# Patient Record
Sex: Male | Born: 1980
Health system: Southern US, Community
[De-identification: ages and names within clinical notes are randomized; demographics above are authoritative.]

## PROBLEM LIST (undated history)

## (undated) DIAGNOSIS — F32A Depression, unspecified: Secondary | ICD-10-CM

## (undated) DIAGNOSIS — F329 Major depressive disorder, single episode, unspecified: Secondary | ICD-10-CM

## (undated) DIAGNOSIS — F1110 Opioid abuse, uncomplicated: Secondary | ICD-10-CM

## (undated) DIAGNOSIS — F1910 Other psychoactive substance abuse, uncomplicated: Secondary | ICD-10-CM

## (undated) DIAGNOSIS — T402X1A Poisoning by other opioids, accidental (unintentional), initial encounter: Secondary | ICD-10-CM

## (undated) DIAGNOSIS — G709 Myoneural disorder, unspecified: Secondary | ICD-10-CM

## (undated) DIAGNOSIS — F419 Anxiety disorder, unspecified: Secondary | ICD-10-CM

## (undated) HISTORY — DX: Anxiety disorder, unspecified: F41.9

## (undated) HISTORY — DX: Major depressive disorder, single episode, unspecified: F32.9

## (undated) HISTORY — PX: FRACTURE SURGERY: SHX138

## (undated) HISTORY — DX: Myoneural disorder, unspecified: G70.9

## (undated) HISTORY — PX: HERNIA REPAIR: SHX51

## (undated) HISTORY — PX: SPINE SURGERY: SHX786

## (undated) HISTORY — DX: Depression, unspecified: F32.A

---

## 2000-08-18 ENCOUNTER — Encounter: Payer: Self-pay | Admitting: Orthopedic Surgery

## 2000-08-18 ENCOUNTER — Encounter: Admission: RE | Admit: 2000-08-18 | Discharge: 2000-08-18 | Payer: Self-pay | Admitting: Orthopedic Surgery

## 2002-08-25 ENCOUNTER — Emergency Department (HOSPITAL_COMMUNITY): Admission: EM | Admit: 2002-08-25 | Discharge: 2002-08-25 | Payer: Self-pay | Admitting: Emergency Medicine

## 2002-08-25 ENCOUNTER — Encounter: Payer: Self-pay | Admitting: Emergency Medicine

## 2005-07-23 ENCOUNTER — Ambulatory Visit (HOSPITAL_BASED_OUTPATIENT_CLINIC_OR_DEPARTMENT_OTHER): Admission: RE | Admit: 2005-07-23 | Discharge: 2005-07-23 | Payer: Self-pay | Admitting: Orthopedic Surgery

## 2005-12-28 ENCOUNTER — Ambulatory Visit (HOSPITAL_BASED_OUTPATIENT_CLINIC_OR_DEPARTMENT_OTHER): Admission: RE | Admit: 2005-12-28 | Discharge: 2005-12-28 | Payer: Self-pay | Admitting: Orthopedic Surgery

## 2009-05-23 ENCOUNTER — Encounter: Admission: RE | Admit: 2009-05-23 | Discharge: 2009-05-23 | Payer: Self-pay | Admitting: Family Medicine

## 2009-09-07 ENCOUNTER — Emergency Department (HOSPITAL_BASED_OUTPATIENT_CLINIC_OR_DEPARTMENT_OTHER): Admission: EM | Admit: 2009-09-07 | Discharge: 2009-09-07 | Payer: Self-pay | Admitting: Emergency Medicine
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
## 2010-05-29 NOTE — Op Note (Signed)
NAME:  Samuel Andrade, Samuel Andrade NO.:  [id number]   MEDICAL RECORD NO.:  [id number]          PATIENT TYPE:  AMB   LOCATION:  DSC                          FACILITY:  [facility]   PHYSICIAN:  Katy Fitch. Sypher, M.D. DATE OF BIRTH:  05-07-1980   DATE OF PROCEDURE:  12/28/2005  DATE OF DISCHARGE:                               OPERATIVE REPORT   .   PREOPERATIVE DIAGNOSIS:  Status post comminuted proximal phalangeal,  proximal metaphyseal and periarticular fracture of left small finger  sustained in July of 2007, status post closed reduction and percutaneous  intramedullary Kirschner wire fixation with development of flexor  tenodesis and inability to actively flex left small finger IP joints.   POSTOPERATIVE DIAGNOSIS:  Status post comminuted proximal phalangeal,  proximal metaphyseal and periarticular fracture of left small finger  sustained in July of 2007, status post closed reduction and percutaneous  intramedullary Kirschner wire fixation with development of flexor  tenodesis and inability to actively flex left small finger IP joints.   OPERATION:  Flexor tenolysis of flexor digitorum superficialis and  profundus tendons beneath A1, A2 and A3 pulleys.   OPERATING SURGEON:  Samuel Andrade, M.D.   ASSISTANT:  Samuel Andrade, P.A.-C.   ANESTHESIA:  Is general sedation and proximal forearm level IV regional  supplemented by a 0.25% Marcaine metacarpal head level block;  supervising anesthesiologist, Dr. Gypsy Balsam.   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 Kristeen Miss for evaluation and  management of a comminuted displaced fracture of left small finger  proximal phalanx in July of 2007.   We advised Samuel Andrade to undergo closed reduction percutaneous  Kirschner wire fixation.  Surgery was accomplished on July 23, 2005.  Anatomic reduction of the fracture was achieved.   Despite positioning Samuel Andrade finger  to allow immediate passive  range of motion exercises while his fracture healed, he developed a  profound tenodesis of his superficialis and profundus tendons, leading  him to have full passive motion of his finger but minimal active  flexion.   We allowed Samuel Andrade to perform exercise for 5 months.   His fracture went on to heal anatomically.  However, x-rays  approximately 3 months postoperative demonstrated a turret exostosis on  the flexor surface of his proximal phalangeal metaphysis at the site of  probable adherence of his profundus and superficialis tendons to the  fracture callus.   We advised tenolysis at this time.   He is now on winter break from school and returns for flexor tenolysis   PROCEDURE:  Samuel Andrade is brought to the operating room and placed in  supine position on the operating table.   Following light sedation, an IV regional block was placed at proximal  forearm level on the left.  The arm was then prepped with Betadine soap  solution and sterilely draped.  At 12 minutes, anesthesia was excellent.   The arm was prepped with Betadine soap solution and sterilely draped.  The procedure commenced with a Brunner zigzag incision exposing A2 and  A1.  A  series of tenolysis knives, micro tenotomy scissors and Freer  elevator dissections were accomplished, relieving adhesions between the  superficialis profundus tendons deep to the A1 and A2 pulleys.  With the  use of an Allis clamp, traction was applied to the superficialis  profundus tendons.  Tenodesis still persisted at the distal portion of  the A2 pulley.  Therefore, a second flexor sheath exposure was created  distal to the A2 pulley, proximal the C1.   At this level, the fracture callus was exposed and a Therapist, nutritional used  to separate the tendon from the periosteum and callus.  The tenolysis  knives were used as well followed by use of a rongeur to remove  hypertrophic scar from the dorsal  surface of the profundus tendon.   Thereafter, with traction, full passive flexion of the interphalangeal  joints was recovered.  The superficialis tendons still had the degree of  tenodesis over the proximal phalangeal segment; however, due to the  primary function of the small finger profundus, I elected not to  completely dissect free the superficialis tendons out of concern for  creating a possible rupture due to loss of blood supply.   [name]'s wounds were then infiltrated with 0.25% Marcaine.  The  tourniquet was released, and he was awakened from sedation.  Mr.  Anaya was able to demonstrate full active flexion of his finger,  bringing the small finger nail to the distal palmar crease.  He was able  to fully extend the MP joint and extend the PIP joint to a 5-degree  flexion contracture.   He was able to witness this range of motion.  His wound was then  repaired with mattress sutures of 5-0 nylon followed by dressing with  Xeroflo sterile gauze and Coban.  There were no apparent complications.   For aftercare, [name] is provided prescription for Keflex 500 mg 1  p.o. q.8h. x4 days for prophylactic antibiotic and Percocet 5 mg 1 p.o.  q.4-6h. pain 30 tablets without refill and finally Motrin 600 mg 1 p.o.  q.6h. p.r.n. pain.      Katy Fitch Sypher, M.D.  Electronically Signed     RVS/MEDQ  D:  12/28/2005  T:  12/29/2005  Job:  [id number]   cc:   Dyke Brackett, M.D.

## 2011-06-18 ENCOUNTER — Ambulatory Visit (INDEPENDENT_AMBULATORY_CARE_PROVIDER_SITE_OTHER): Payer: BC Managed Care – PPO | Admitting: Family Medicine

## 2011-06-18 VITALS — BP 117/77 | HR 72 | Temp 98.4°F | Resp 16 | Ht 75.75 in | Wt 244.0 lb

## 2011-06-18 DIAGNOSIS — M5116 Intervertebral disc disorders with radiculopathy, lumbar region: Secondary | ICD-10-CM

## 2011-06-18 DIAGNOSIS — M5126 Other intervertebral disc displacement, lumbar region: Secondary | ICD-10-CM

## 2011-06-18 DIAGNOSIS — M546 Pain in thoracic spine: Secondary | ICD-10-CM

## 2011-06-18 DIAGNOSIS — M549 Dorsalgia, unspecified: Secondary | ICD-10-CM

## 2011-06-18 MED ORDER — HYDROCODONE-ACETAMINOPHEN 5-500 MG PO TABS: 1.0000 | ORAL_TABLET | ORAL | Status: AC | PRN

## 2011-06-18 MED ORDER — OXAPROZIN 600 MG PO TABS: ORAL_TABLET | ORAL | Status: DC

## 2011-06-18 MED ORDER — PREDNISONE 20 MG PO TABS: ORAL_TABLET | ORAL | Status: DC

## 2011-06-18 NOTE — Patient Instructions (Signed)
Rest. Ice back several times daily, or alternate heat then ice Return in 4-5 days if not much worse

## 2011-06-18 NOTE — Progress Notes (Signed)
Subjective: 30 year old white male who slipped in the driveway last night. His left leg extended outward. He did not fall down all the way. However he did have pain and a pop sensation when this happened. He went in the house and pain got steadily worse. It hurt him at about a 6-7/10 level of pain. He had pain during the night last night. He did take about 4 Flexeril over the course of time. These were from an old prescription. He has a prior history of lumbar disc disease with surgery in 2003. He's had some back problems since that time, and taking medications and physical therapy for that. His last bad episode was last October. He does see a Dr. Teressa Senter at the neurosurgery group. 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 He does not do physical labor. He is not have children. He lives alone.  Objective: old lumbar scar is visible. Not much tenderness of the midline low back. He describes pain radiating down the left hip. Not not extremely tender to touch. Straight leg raising test is positive at 30 on the right with contralateral pain down the left leg. Left leg straight leg raising test is positive at about 10. Deep tendon reflexes are minimal but symmetrical.  Assessment: Old Lumbar disc disease with current injury and left lumbar sciatica.  Plan: NSAID Prednisone Pain medicine Muscle relaxant If he is not getting relief or probably send him back to his back surgeon.

## 2012-04-21 ENCOUNTER — Ambulatory Visit (INDEPENDENT_AMBULATORY_CARE_PROVIDER_SITE_OTHER): Payer: BC Managed Care – PPO | Admitting: Family Medicine

## 2012-04-21 VITALS — BP 139/79 | HR 72 | Temp 98.0°F | Resp 17 | Ht 72.0 in | Wt 211.0 lb

## 2012-04-21 DIAGNOSIS — M5126 Other intervertebral disc displacement, lumbar region: Secondary | ICD-10-CM

## 2012-04-21 DIAGNOSIS — M5116 Intervertebral disc disorders with radiculopathy, lumbar region: Secondary | ICD-10-CM

## 2012-04-21 DIAGNOSIS — M549 Dorsalgia, unspecified: Secondary | ICD-10-CM

## 2012-04-21 MED ORDER — OXAPROZIN 600 MG PO TABS: ORAL_TABLET | ORAL | Status: DC

## 2012-04-21 MED ORDER — OXYCODONE-ACETAMINOPHEN 5-325 MG PO TABS: 1.0000 | ORAL_TABLET | Freq: Three times a day (TID) | ORAL | Status: DC | PRN

## 2012-04-21 MED ORDER — CYCLOBENZAPRINE HCL 10 MG PO TABS: 10.0000 mg | ORAL_TABLET | Freq: Three times a day (TID) | ORAL | Status: DC | PRN

## 2012-04-21 MED ORDER — PREDNISONE 20 MG PO TABS: ORAL_TABLET | ORAL | Status: AC

## 2012-04-21 MED ORDER — KETOROLAC TROMETHAMINE 60 MG/2ML IM SOLN
60.0000 mg | Freq: Once | INTRAMUSCULAR | Status: AC
  Administered 2012-04-21: 60 mg via INTRAMUSCULAR

## 2012-04-21 NOTE — Progress Notes (Signed)
[facility]:  Office Visit  Chief Complaint:  Chief Complaint  Patient presents with  . Hip Pain  . Back Pain  . Leg Pain    HPI: Samuel Andrade is a 32 y.o. male who complains of  Acute on chronic back pain, left sided sharp 9-10/10 stabbing pain. Just woke up with it 3 days ago and has not been to work since. 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 Gets really stiff as day progresses, it has progressively gotten worse. Some tingling in big toe and 2nd toes and 5th toe. Pain starts in lower back and then buttock, hip and laterally and posteriorly and then laterally. Has had 2 prior herniated disc s/p surgery by Dr. Emmie Niemann but was has been going to see Dr. Phoebe Perch at [facility] in 2011, last saw him over 1 year ago and MRI was done and was told to return if have repeat sxs. Currently denies any incontinece, feels similar to last episode treated with prednisone, daypro and norco.   MRI from 2011: L4-5: Previous posterior decompression and discectomy on the left.  Recurrent disc herniation in the left posterolateral direction with  caudal down turning. Potential for neural compression on the left.  L5-S1: Previous posterior decompression. Recurrent disc  herniation centrally, somewhat prominent towards the left.  Potential for compression of either S1 nerve root, more likely the  left.   History reviewed. No pertinent past medical history. Past Surgical History  Procedure Laterality Date  . Hernia repair    . Spine surgery    . Fracture surgery     History   Social History  . Marital Status: Single    Spouse Name: N/A    Number of Children: N/A  . Years of Education: N/A   Social History Main Topics  . Smoking status: Current Every Day Smoker -- 0.50 packs/day for 9 years    Types: Cigarettes  . Smokeless tobacco: None  . Alcohol Use: Yes     Comment: once a week  . Drug Use: No  . Sexually Active: Not Currently    Other Topics Concern  . None   Social History Narrative  . None   Family History  Problem Relation Age of Onset  . Hypertension Father    No Known Allergies Prior to Admission medications   Medication Sig Start Date End Date Taking? Authorizing Provider  cyclobenzaprine (FLEXERIL) 10 MG tablet Take 10 mg by mouth 3 (three) times daily as needed.    Historical Provider, MD  oxaprozin (DAYPRO) 600 MG tablet One twice daily for pain and inflammation 06/18/11 06/17/12  Peyton Najjar, MD     ROS: The patient denies fevers, chills, night sweats, unintentional weight loss, chest pain, palpitations, wheezing, dyspnea on exertion, nausea, vomiting, abdominal pain, dysuria, hematuria, melena; + numbness, weakness, tingling.  All other systems have been reviewed and were otherwise negative with the exception of those mentioned in the HPI and as above.    PHYSICAL EXAM: Filed Vitals:   04/21/12 1820  BP: 139/79  Pulse: 72  Temp: 98 F (36.7 C)  Resp: 17   Filed Vitals:   04/21/12 1820  Height: 6' (1.829 m)  Weight: 211 lb (95.709 kg)   Body mass index is 28.61 kg/(m^2).  General: Alert,  Moderate  distress HEENT:  Normocephalic, atraumatic, oropharynx patent.  Cardiovascular:  Regular rate and rhythm, no rubs murmurs or gallops.  No Carotid bruits, radial  pulse intact. No pedal edema.  Respiratory: Clear to auscultation bilaterally.  No wheezes, rales, or rhonchi.  No cyanosis, no use of accessory musculature GI: No organomegaly, abdomen is soft and non-tender, positive bowel sounds.  No masses. Skin: No rashes. Neurologic: Facial musculature symmetric. Psychiatric: Patient is appropriate throughout our interaction. Lymphatic: No cervical lymphadenopathy Musculoskeletal: Gait intact. Low back tender Left leg + decrease ROM + straight leg + decrease sensation + good cap refill Slightly hyperreflexive No saddle anesthesia    LABS: No results found for this or any  previous visit.   EKG/XRAY:   Primary read interpreted by Dr. Conley Rolls at [facility].   ASSESSMENT/PLAN: Encounter Diagnoses  Name Primary?  . Lumbar disc disease with radiculopathy Yes  . Back pain    Pleasant 32 y/o male who is in moderate-sever distress from acute on chronic back pain with radiculopathy.  Rx Prednisone, Daypro, Flexeril, Percocet 1 Dose of toradol given to patient Referral to neurosicence ASAP Will defer xrays because what he needs is an MRI pending neuro referral.   Sxs similar to last episode. Advise to got to ER if sxs worsen ie incontinence, saddle anessthesia, worsening numbness/weakness    Samuel Westergren PHUONG, DO 04/21/2012 7:01 PM

## 2012-04-26 ENCOUNTER — Other Ambulatory Visit: Payer: Self-pay | Admitting: Family Medicine

## 2012-04-26 ENCOUNTER — Telehealth: Payer: Self-pay

## 2012-04-26 DIAGNOSIS — M549 Dorsalgia, unspecified: Secondary | ICD-10-CM

## 2012-04-26 MED ORDER — OXYCODONE-ACETAMINOPHEN 5-325 MG PO TABS: 1.0000 | ORAL_TABLET | Freq: Three times a day (TID) | ORAL | Status: DC | PRN

## 2012-04-26 NOTE — Telephone Encounter (Signed)
Patient is checking on status of referral  Patient is requesting refills on oxyCODONE-acetaminophen (ROXICET) 5-325 MG per tablet   [phone] (H)

## 2012-04-26 NOTE — Telephone Encounter (Signed)
Dr. Conley Rolls, This patient would like to know if you can refill his oxycodone?  His referral is still in the process of being made.

## 2012-04-26 NOTE — Telephone Encounter (Signed)
Spoke with patient and let him know that they are still working on referral.  We would call him if Dr. Conley Rolls would like to fill his oxycodone.  He stated that was fine

## 2012-05-15 ENCOUNTER — Telehealth: Payer: Self-pay

## 2012-05-15 NOTE — Telephone Encounter (Signed)
PT REQUESTING PAIN MEDS UNTIL HIS  APPT WITH NEUROSURGEON MAY 12.    BEST PHONE [phone]   [facility] @ [place].

## 2012-05-16 ENCOUNTER — Other Ambulatory Visit: Payer: Self-pay | Admitting: Family Medicine

## 2012-05-16 DIAGNOSIS — M549 Dorsalgia, unspecified: Secondary | ICD-10-CM

## 2012-05-16 DIAGNOSIS — M5116 Intervertebral disc disorders with radiculopathy, lumbar region: Secondary | ICD-10-CM

## 2012-05-16 MED ORDER — OXYCODONE-ACETAMINOPHEN 5-325 MG PO TABS: 1.0000 | ORAL_TABLET | Freq: Three times a day (TID) | ORAL | Status: DC | PRN

## 2012-05-16 MED ORDER — CYCLOBENZAPRINE HCL 10 MG PO TABS: 10.0000 mg | ORAL_TABLET | Freq: Three times a day (TID) | ORAL | Status: DC | PRN

## 2012-05-16 MED ORDER — OXAPROZIN 600 MG PO TABS: ORAL_TABLET | ORAL | Status: AC

## 2012-05-16 NOTE — Telephone Encounter (Signed)
Patient notified and voiced understanding.

## 2012-10-30 ENCOUNTER — Ambulatory Visit (INDEPENDENT_AMBULATORY_CARE_PROVIDER_SITE_OTHER): Payer: BC Managed Care – PPO | Admitting: Family Medicine

## 2012-10-30 VITALS — BP 132/88 | HR 81 | Temp 98.0°F | Resp 18 | Wt 216.0 lb

## 2012-10-30 DIAGNOSIS — B079 Viral wart, unspecified: Secondary | ICD-10-CM

## 2012-10-30 NOTE — Progress Notes (Signed)
  Subjective:  This chart was scribed for Samuel Sidle, MD by Quintella Reichert, ED scribe.  This patient was seen in room [facility] Room 13 and the patient's care was started at 7:37 PM.   Patient ID: Samuel Andrade, male    DOB: 03/23/1980, 32 y.o.   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 Pt states that for the past month he had a painful "big black thing" on the bottom of his right foot that was growing progressively-larger and more painful.  On arrival when he removed his shoe the area was gone and he saw a shard of glass there which he picked out and the area began bleeding.  He also has a black spot on his right index finger which he noticed about 10 days ago which he initially thought was a scab.  This area has also been growing progressively more painful.  He notes that he was picking up toads several days prior to noticing this.  Pt notes that he has a family h/o melanoma and this is why his mother recommended he have the areas checked.     No past medical history on file.   Prior to Admission medications   Medication Sig Start Date End Date Taking? Authorizing Provider  cyclobenzaprine (FLEXERIL) 10 MG tablet Take 1 tablet (10 mg total) by mouth 3 (three) times daily as needed. 05/16/12   Thao P Le, DO  oxaprozin (DAYPRO) 600 MG tablet One twice daily for pain and inflammation 05/16/12 05/16/13  Thao P Le, DO  oxyCODONE-acetaminophen (ROXICET) 5-325 MG per tablet Take 1 tablet by mouth every 8 (eight) hours as needed for pain. 05/16/12   Thao P Le, DO    No Known Allergies   Review of Systems  Skin:       Small painful discolored areas on right foot and right index finger       Objective:   Physical Exam  Nursing note and vitals reviewed. Constitutional: No distress.  Cardiovascular: Normal rate.    Pulmonary/Chest: Effort normal. No respiratory distress.  Neurological: He is alert.  Skin: Skin is warm and dry.  Right index finger has 2-mm hyperpigmented, crusty papule Right plantar foot has 5-mm verrucous lesion at the head of the 4th metatarsal Both of these areas were sprayed with liquid nitrogen for 20 seconds  Psychiatric: He has a normal mood and affect. His behavior is normal.     BP 132/88  Pulse 81  Temp(Src) 98 F (36.7 C) (Oral)  Resp 18  Wt 216 lb (97.977 kg)  BMI 29.29 kg/m2     Assessment & Plan:   Wart on right foot and right index finger  Warts Recheck 3 weeks unless completely cleared Signed, Samuel Sidle, MD

## 2013-02-09 ENCOUNTER — Ambulatory Visit (INDEPENDENT_AMBULATORY_CARE_PROVIDER_SITE_OTHER): Payer: BC Managed Care – PPO | Admitting: Family Medicine

## 2013-02-09 VITALS — BP 132/82 | HR 61 | Temp 98.0°F | Resp 17 | Ht 76.0 in | Wt 220.0 lb

## 2013-02-09 DIAGNOSIS — F32A Depression, unspecified: Secondary | ICD-10-CM

## 2013-02-09 DIAGNOSIS — F4323 Adjustment disorder with mixed anxiety and depressed mood: Secondary | ICD-10-CM

## 2013-02-09 DIAGNOSIS — F3289 Other specified depressive episodes: Secondary | ICD-10-CM

## 2013-02-09 DIAGNOSIS — F329 Major depressive disorder, single episode, unspecified: Secondary | ICD-10-CM

## 2013-02-09 MED ORDER — SERTRALINE HCL 50 MG PO TABS: 50.0000 mg | ORAL_TABLET | Freq: Every day | ORAL | Status: DC

## 2013-02-09 MED ORDER — LORAZEPAM 1 MG PO TABS: 1.0000 mg | ORAL_TABLET | Freq: Two times a day (BID) | ORAL | Status: DC | PRN

## 2013-02-09 NOTE — Patient Instructions (Signed)
Recommend seeing a counselor:  Karmen BongoAaron Stewart: [address] [phone] Maisie Fushomas Hedding: [address]  [phone]  Sertraline 50 mg one daily Lorazepam 1 mg twice daily  Return in one week

## 2013-02-09 NOTE — Progress Notes (Signed)
Subjective: Patient's girlfriend of 2 or 3 years has a history of bipolar and a lot of issues. She moved into a group home setting recently, operated by a church group, that is designed to help get young lady's like her to restart on in life back on the feet. This is been very hard on him. She's been gone 5 days, and he is not allowed communication with anyone outside of the group setting. He has gotten very depressed. He has had panic attack type palpitations and shortness of breath. He is not sleeping well. He missed class and work on Wednesday, otherwise has kept on functioning. His father is the person he can talk to, and his father urged him to come on in here. He denies any suicidal thoughts. He occasionally attends a quicker church with his mother, but does not have any major spiritual support. He used to exercise regularly, walking 3-5 miles daily, but has not been doing that recently. He does have history of some back pain. He rarely drinks and does not use drugs.  Objective: Very depressed countenance. He says normally he is much more upbeat.  Assessment: Depression Anxiety and panic  Plan: Sertraline 50 mg one daily Lorazepam 1 mg twice a day when necessary Return in one week for re\re check  Recommended he see a counselor and I gave him a couple of names.

## 2014-01-17 ENCOUNTER — Emergency Department (HOSPITAL_COMMUNITY)
Admission: EM | Admit: 2014-01-17 | Discharge: 2014-01-18 | Disposition: A | Discharge status: Home / Self Care | Payer: BLUE CROSS/BLUE SHIELD | Attending: Emergency Medicine | Admitting: Emergency Medicine

## 2014-01-17 ENCOUNTER — Encounter (HOSPITAL_COMMUNITY): Payer: Self-pay

## 2014-01-17 DIAGNOSIS — Z9889 Other specified postprocedural states: Secondary | ICD-10-CM | POA: Insufficient documentation

## 2014-01-17 DIAGNOSIS — Z720 Tobacco use: Secondary | ICD-10-CM | POA: Insufficient documentation

## 2014-01-17 DIAGNOSIS — M5442 Lumbago with sciatica, left side: Secondary | ICD-10-CM | POA: Diagnosis not present

## 2014-01-17 DIAGNOSIS — M549 Dorsalgia, unspecified: Secondary | ICD-10-CM | POA: Diagnosis present

## 2014-01-17 DIAGNOSIS — G8929 Other chronic pain: Secondary | ICD-10-CM | POA: Diagnosis not present

## 2014-01-17 NOTE — ED Notes (Signed)
Pt states that he had back surgery several years ago and the pain is back in his lower back that radiates down his leg, this is affecting his sleep. His parents are here and want him evaluated for substance abuse, he denies. Pt is nodding off in triage and states he only wants to be evaluated for insomnia and back pain.

## 2014-01-18 LAB — RAPID URINE DRUG SCREEN, HOSP PERFORMED
AMPHETAMINES: NOT DETECTED
Barbiturates: NOT DETECTED
Benzodiazepines: NOT DETECTED
Cocaine: NOT DETECTED
Opiates: NOT DETECTED
TETRAHYDROCANNABINOL: NOT DETECTED

## 2014-01-18 MED ORDER — IBUPROFEN 800 MG PO TABS
800.0000 mg | ORAL_TABLET | Freq: Once | ORAL | Status: AC
  Administered 2014-01-18: 800 mg via ORAL
  Filled 2014-01-18: qty 1

## 2014-01-18 NOTE — BH Assessment (Signed)
Spoke to Myna HidalgoMaria C Teup, RN who said Pt no longer wants to speak to TTS and that Dr. Mora Bellmanni had cancelled the TTS consult order.  Harlin RainFord Ellis Ria CommentWarrick Jr, LPC, [facility] Triage Specialist [phone]

## 2014-01-18 NOTE — ED Provider Notes (Signed)
CSN: [id number]     Arrival date & time 01/17/14  2248 History   First MD Initiated Contact with Patient 01/18/14 0040     Chief Complaint  Patient presents with  . Back Pain     (Consider location/radiation/quality/duration/timing/severity/associated sxs/prior Treatment) HPI  Samuel Andrade is a 34 y.o. male with past medical history of heroin abuse presenting today with acute on chronic back pain. Patient states he had back surgery 13 years ago, over the past month he's had new sharp pain that shoots down his left leg to his foot. He states this feels like nerve pain, he denies this occurring previously. He denies any fevers or recent infections or history of malignancy. Patient has not seen his neurosurgeon in number of years. Family in the room is also concern that the patient has relapsed on heroin. Patient denies abusing any drugs. He has no SI or HI. Patient states he had a recent junk screen last week that was negative for all illicit substances. Patient has no further complaints.  10 Systems reviewed and are negative for acute change except as noted in the HPI.      History reviewed. No pertinent past medical history. Past Surgical History  Procedure Laterality Date  . Hernia repair    . Spine surgery    . Fracture surgery     Family History  Problem Relation Age of Onset  . Hypertension Father    History  Substance Use Topics  . Smoking status: Current Every Day Smoker -- 0.50 packs/day for 9 years    Types: Cigarettes  . Smokeless tobacco: Not on file  . Alcohol Use: Yes     Comment: once a week    Review of Systems    Allergies  Review of patient's allergies indicates no known allergies.  Home Medications   Prior to Admission medications   Medication Sig Start Date End Date Taking? Authorizing Provider  cyclobenzaprine (FLEXERIL) 10 MG tablet Take 1 tablet (10 mg total) by mouth 3 (three) times daily as needed. Patient not taking: Reported on 01/17/2014  05/16/12   Thao P Le, DO  LORazepam (ATIVAN) 1 MG tablet Take 1 tablet (1 mg total) by mouth 2 (two) times daily as needed for anxiety. Patient not taking: Reported on 01/17/2014 02/09/13   Peyton Najjaravid H Hopper, MD  oxyCODONE-acetaminophen (ROXICET) 5-325 MG per tablet Take 1 tablet by mouth every 8 (eight) hours as needed for pain. Patient not taking: Reported on 01/17/2014 05/16/12   Thao P Le, DO  sertraline (ZOLOFT) 50 MG tablet Take 1 tablet (50 mg total) by mouth daily. Patient not taking: Reported on 01/17/2014 02/09/13   Peyton Najjaravid H Hopper, MD   BP 131/77 mmHg  Pulse 82  Temp(Src) 97.8 F (36.6 C) (Oral)  Resp 16  SpO2 99% Physical Exam  Constitutional: He is oriented to person, place, and time. Vital signs are normal. He appears well-developed and well-nourished.  Non-toxic appearance. He does not appear ill. No distress.  HENT:  Head: Normocephalic and atraumatic.  Nose: Nose normal.  Mouth/Throat: Oropharynx is clear and moist. No oropharyngeal exudate.  Eyes: Conjunctivae and EOM are normal. Pupils are equal, round, and reactive to light. No scleral icterus.  Neck: Normal range of motion. Neck supple. No tracheal deviation, no edema, no erythema and normal range of motion present. No thyroid mass and no thyromegaly present.  Cardiovascular: Normal rate, regular rhythm, S1 normal, S2 normal, normal heart sounds, intact distal pulses and normal pulses.  Exam reveals no gallop and no friction rub.   No murmur heard. Pulses:      Radial pulses are 2+ on the right side, and 2+ on the left side.       Dorsalis pedis pulses are 2+ on the right side, and 2+ on the left side.  Pulmonary/Chest: Effort normal and breath sounds normal. No respiratory distress. He has no wheezes. He has no rhonchi. He has no rales.  Abdominal: Soft. Normal appearance and bowel sounds are normal. He exhibits no distension, no ascites and no mass. There is no hepatosplenomegaly. There is no tenderness. There is no rebound, no  guarding and no CVA tenderness.  Musculoskeletal: Normal range of motion. He exhibits no edema or tenderness.  Lymphadenopathy:    He has no cervical adenopathy.  Neurological: He is alert and oriented to person, place, and time. He has normal strength. No cranial nerve deficit or sensory deficit. He exhibits normal muscle tone. GCS eye subscore is 4. GCS verbal subscore is 5. GCS motor subscore is 6.  Normal strength and sensation 4 extremities, normal cerebellar testing, normal gait  Skin: Skin is warm, dry and intact. No petechiae and no rash noted. He is not diaphoretic. No erythema. No pallor.  Psychiatric:  Flat affect, slow to respond.  Nursing note and vitals reviewed.   ED Course  Procedures (including critical care time) Labs Review Labs Reviewed  URINE RAPID DRUG SCREEN (HOSP PERFORMED)    Imaging Review No results found.   EKG Interpretation None      MDM   Final diagnoses:  None    Patient since emergency department for acute on chronic back pain. Neuropathic pain was explained to him, he was given a Motrin advised to follow-up with his neurosurgeon for possible outpatient MRI. Will obtain TTS consult for possible heroin abuse. Also pending urine drug screen.  Urine drug screen is negative. Patient initially agreed to speak with TTS, now he states he would like to go home. I think this is reasonable, he has no SI or HI. Family was concerned for heroin abuse however the patient is denying this and urine screen is negative. She does have decision-making capacity, his vital signs remain within his normal limits and he is safe for discharge.    Tomasita Crumble, MD 01/18/14 [phone]

## 2014-01-18 NOTE — Discharge Instructions (Signed)
Sciatica Mr. Samuel Andrade, you were seen today for back pain.  Continue to take Motrin as needed for pain and follow-up with a primary care physician or your neurosurgeon for possible MRI. If any symptoms worsen come back to emergency department immediately for repeat evaluation. Thank you. Sciatica is pain, weakness, numbness, or tingling along your sciatic nerve. The nerve starts in the lower back and runs down the back of each leg. Nerve damage or certain conditions pinch or put pressure on the sciatic nerve. This causes the pain, weakness, and other discomforts of sciatica. HOME CARE   Only take medicine as told by your doctor.  Apply ice to the affected area for 20 minutes. Do this 3-4 times a day for the first 48-72 hours. Then try heat in the same way.  Exercise, stretch, or do your usual activities if these do not make your pain worse.  Go to physical therapy as told by your doctor.  Keep all doctor visits as told.  Do not wear high heels or shoes that are not supportive.  Get a firm mattress if your mattress is too soft to lessen pain and discomfort. GET HELP RIGHT AWAY IF:   You cannot control when you poop (bowel movement) or pee (urinate).  You have more weakness in your lower back, lower belly (pelvis), butt (buttocks), or legs.  You have redness or puffiness (swelling) of your back.  You have a burning feeling when you pee.  You have pain that gets worse when you lie down.  You have pain that wakes you from your sleep.  Your pain is worse than past pain.  Your pain lasts longer than 4 weeks.  You are suddenly losing weight without reason. MAKE SURE YOU:   Understand these instructions.  Will watch this condition.  Will get help right away if you are not doing well or get worse. Document Released: 10/07/2007 Document Revised: 06/29/2011 Document Reviewed: 05/09/2011 [facility] Patient Information 2015 [place]. This information is not intended to replace  advice given to you by your health care provider. Make sure you discuss any questions you have with your health care provider.

## 2014-01-18 NOTE — BH Assessment (Signed)
Received call for assessment. Spoke with Dr. Tomasita CrumbleAdeleke Oni who said Pt's family is concerned he is using heroin and Pt wants to speak to TTS about his situation. Tele-assessment will be initiated.  Harlin RainFord Ellis Ria CommentWarrick Jr, LPC, [facility] Triage Specialist [phone]

## 2014-01-18 NOTE — ED Notes (Addendum)
Pt reports low back pain, reports he injured his back several years ago.  No new injury at this time.  Pt reports pain radiates down to his leg-unable to sleep d/t pain.  Asked pt if he has been drinking alcohol or doing any drugs.  Pt states he only does marijuana and the last time he smoke marijuana was 3 days ago.  Pt denies opiate, benzo, or cocaine use at this time.  Pt states "why do I look like I'm on something right now?"   Pt states he wants to know what's in his urine drug screen as well.  Pt with very mild slurred speech and moves slow.  Pt's gait is steady.  Pt is calm and cooperative at this time.

## 2014-07-11 ENCOUNTER — Ambulatory Visit (INDEPENDENT_AMBULATORY_CARE_PROVIDER_SITE_OTHER): Payer: BLUE CROSS/BLUE SHIELD | Admitting: Family Medicine

## 2014-07-11 VITALS — BP 136/98 | HR 74 | Temp 98.2°F | Resp 18 | Ht 75.0 in | Wt 212.0 lb

## 2014-07-11 DIAGNOSIS — I781 Nevus, non-neoplastic: Secondary | ICD-10-CM

## 2014-07-11 DIAGNOSIS — F329 Major depressive disorder, single episode, unspecified: Secondary | ICD-10-CM | POA: Diagnosis not present

## 2014-07-11 DIAGNOSIS — F411 Generalized anxiety disorder: Secondary | ICD-10-CM | POA: Diagnosis not present

## 2014-07-11 DIAGNOSIS — F32A Depression, unspecified: Secondary | ICD-10-CM

## 2014-07-11 MED ORDER — CLONAZEPAM 1 MG PO TABS: 1.0000 mg | ORAL_TABLET | Freq: Two times a day (BID) | ORAL | Status: DC | PRN

## 2014-07-11 MED ORDER — SERTRALINE HCL 50 MG PO TABS: 50.0000 mg | ORAL_TABLET | Freq: Every day | ORAL | Status: DC

## 2014-07-11 NOTE — Progress Notes (Signed)
Chief Complaint:  Chief Complaint  Patient presents with  . Nevus    On back, has increased in size and becoming more irritating  . Anxiety    Trouble falling asleep and nightmares  . Depression    Been on Zoloft and Klonopin, lost touch with therapist and cut off medication, been having hard time without medication    HPI: Samuel Andrade is a 34 y.o. male who is here for    1. Anxiety and depression. He has had worsening symptoms since he has been off of his Zoloft 50 mg daily and also his clonazepam 1 mg by mouth twice a day. He was at the [facility] and did not want to see the therapist anymore Amanda Peaon Swain and because of this the psychiatrist at the [facility] decided that she was not going to refill his medications. So they tapered him off with 10 days' worth of Zoloft taper and he has been off of his medicines for about a month now. He has had more irritability, sleep issues. He sleeps anywhere from 3-13 hours a day. He has been less social. He has been more depressed. He denies any suicidal homicidal ideation or hallucinations. He currently does not have any palpitations, chest pain, shortness of breath, headaches, vision changes, auditory or visual issues.   He has good support. He denies having any substance abuse issues. He [facility] and not a narcotic abuse or substance abuse issue.  2. He has a mole on his back that irrittes him and is wondering if we can take it off, it is big and dark but not sure if it has grown in size. He woul dlike to be referred to dermatology since does nt want to get it removed in office today.   Past Medical History  Diagnosis Date  . Anxiety   . Depression   . Neuromuscular disorder    Past Surgical History  Procedure Laterality Date  . Hernia repair    . Spine surgery    . Fracture surgery     History   Social History  . Marital Status: Single    Spouse Name: N/A    . Number of Children: N/A  . Years of Education: N/A   Social History Main Topics  . Smoking status: Current Every Day Smoker -- 0.50 packs/day for 9 years    Types: Cigarettes  . Smokeless tobacco: Not on file  . Alcohol Use: Yes     Comment: once a week  . Drug Use: No  . Sexual Activity: Not Currently   Other Topics Concern  . None   Social History Narrative   Family History  Problem Relation Age of Onset  . Hypertension Father   . Stroke Father   . Stroke Maternal Grandmother   . Mental illness Maternal Grandmother   . Stroke Maternal Grandfather   . Heart disease Maternal Grandfather   . Stroke Paternal Grandmother   . Diabetes Paternal Grandfather    No Known Allergies Prior to Admission medications   Medication Sig Start Date End Date Taking? Authorizing Provider  clonazePAM (KLONOPIN) 1 MG tablet Take 1 mg by mouth 2 (two) times daily.    Historical Provider, MD  cyclobenzaprine (FLEXERIL) 10 MG tablet Take 1 tablet (10 mg total) by mouth 3 (three) times daily as needed. Patient not taking: Reported on 01/17/2014 05/16/12   Naidelyn Parrella P Laylamarie Meuser, DO  LORazepam (ATIVAN) 1 MG tablet Take 1 tablet (1 mg total) by mouth 2 (two) times daily as needed for anxiety. Patient not taking: Reported on 01/17/2014 02/09/13   Peyton Najjar, MD  oxyCODONE-acetaminophen (ROXICET) 5-325 MG per tablet Take 1 tablet by mouth every 8 (eight) hours as needed for pain. Patient not taking: Reported on 01/17/2014 05/16/12   Journey Ratterman P Aryel Edelen, DO  sertraline (ZOLOFT) 50 MG tablet Take 1 tablet (50 mg total) by mouth daily. Patient not taking: Reported on 01/17/2014 02/09/13   Peyton Najjar, MD     ROS: The patient denies fevers, chills, night sweats, unintentional weight loss, chest pain, palpitations, wheezing, dyspnea on exertion, nausea, vomiting, abdominal pain, dysuria, hematuria, melena, numbness, weakness, or tingling.   All other systems have been reviewed and were otherwise negative with the exception of those  mentioned in the HPI and as above.    PHYSICAL EXAM: Filed Vitals:   07/11/14 0828  BP: 136/98  Pulse: 74  Temp: 98.2 F (36.8 C)  Resp: 18   Filed Vitals:   07/11/14 0828  Height:  (1.905 m)  Weight: 212 lb (96.163 kg)   Body mass index is 26.5 kg/(m^2).   General: Alert, no acute distress HEENT:  Normocephalic, atraumatic, oropharynx patent. EOMI, PERRLA Cardiovascular:  Regular rate and rhythm, no rubs murmurs or gallops.  No Carotid bruits, radial pulse intact. No pedal edema.  Respiratory: Clear to auscultation bilaterally.  No wheezes, rales, or rhonchi.  No cyanosis, no use of accessory musculature GI: No organomegaly, abdomen is soft and non-tender, positive bowel sounds.  No masses. Skin:  1/4 inch  Hyperpigmented nevus on the lower back. Does not appear to be malignant. But does bother him. Neurologic: Facial musculature symmetric. Psychiatric: Patient is appropriate throughout our interaction. Lymphatic: No cervical lymphadenopathy Musculoskeletal: Gait intact.   LABS: Results for orders placed or performed during the hospital encounter of 01/17/14  Urine rapid drug screen (hosp performed)  Result Value Ref Range   Opiates NONE DETECTED NONE DETECTED   Cocaine NONE DETECTED NONE DETECTED   Benzodiazepines NONE DETECTED NONE DETECTED   Amphetamines NONE DETECTED NONE DETECTED   Tetrahydrocannabinol NONE DETECTED NONE DETECTED   Barbiturates NONE DETECTED NONE DETECTED     EKG/XRAY:   Primary read interpreted by Dr. Conley Rolls at [facility].   ASSESSMENT/PLAN: Encounter Diagnoses  Name Primary?  . Depression Yes  . Anxiety state   . Nevus, non-neoplastic     He is scheduled to see a psychiatrist/psychologist he is not sure which in one week.  I checked his [place] controlled substance database and he does not have any illegal activities  We will go ahead and prescribed him Zoloft and also clonazepam  He has signed a release so we can get information  from the [facility].  He will be referred to dermatology  Follow-up in one month if he does not have a psychiatrist by that time.  Gross sideeffects, risk and benefits, and alternatives of medications d/w patient. Patient is aware that all medications have potential sideeffects and we are unable to predict every sideeffect or drug-drug interaction that may occur.  Hector Taft PHUONG, DO 07/11/2014 9:48 AM

## 2014-07-11 NOTE — Patient Instructions (Signed)
RESOURCE GUIDE ° °Chronic Pain Problems: °Contact [facility]  [phone] °Patients need to be referred by their primary care doctor. ° °Insufficient Money for Medicine: °Contact United Way:  call (888) 892-1162 ° °No Primary Care Doctor: °- Call [facility]  [phone] - can help you locate a primary care doctor that  accepts your insurance, provides certain services, etc. °- Physician Referral Service- 1-800-533-3463 ° °Agencies that provide inexpensive medical care: °- [facility]  [phone] °- [facility]  [phone] °- [facility]  [phone] °- [facility]  [phone] °- [facility]  [phone] °- [facility]  [phone] ° °Medicaid-accepting [place] Providers: °- [facility]- [address] ° [phone], Mon-Fri 9am-7pm, Sat 9am-1pm °- [facility]- [address], [address] ° [phone] °- [facility]- [address], [address] ° [phone] °- [facility]- [address] [place] [address] ° [phone] °- Veita Bland- [address], [phone] ° Only accepts [place] Access Medicaid patients after they have their name  applied to their card ° °Self Pay (no insurance) in [place]: °- Sickle Cell Patients - [facility] ° [address], [phone] °- [facility]- [address] ° [phone] °      -      [facility]- [address] °      -     [facility]- see information above (Speak to Pam H if you do not have insurance) °      -  [facility] [place]- [address],  [phone] °      -  [facility]- [address] [place] [address], [phone] °      -  Dr Osei-Bonsu-  [address], [place], [phone] °      -  [facility] - [address], [phone] °      -  [facility]- [address] [place] [address], [phone], also  [address], [phone] °      -     [facility]- [address], [phone], 1st & 3rd Saturday °        every month, 10am-1pm ° -     [facility] °  [address], [place]. °  Phone:  [phone], Fax:  [phone]. Hours of Operation:  9 am - 6 pm, M-F. ° -      [facility] °  [address], [place] °  Phone: [phone], Fax: [phone]. Hours of Operation:  8:30 am - 5:30 pm, M-F. ° °[facility] °[address] °[place], [place] [address] °(336) 832-4777 ° °[facility] °[address] °[place], [place] [address] °(336) [phone] ° °1) Find a Doctor and Pay Out of Pocket °Although you won't have to find out who is covered by your insurance plan, it is a good idea to ask around and get recommendations. You will then need to call the office and see if the doctor you have chosen will accept you as a new patient and what types of options they offer for patients who are self-pay. Some doctors offer discounts or will set up payment plans for their patients who do not have insurance, but   you will need to ask so you aren't surprised when you get to your appointment. ° °2) Contact Your Local Health Department °Not all health departments have doctors that can see patients for sick visits, but many do, so it is worth a call to see if yours does. If you don't know where your local health department is, you can check in your phone book. The CDC also has a tool to help you locate your state's health department, and many state websites also have listings of all of their local health departments. ° °3) Find a Walk-in Clinic °If your illness is not likely to be very severe or complicated, you may want to try a walk in clinic. These are popping up all over the country in pharmacies, drugstores, and shopping centers. They're usually staffed by nurse practitioners or physician assistants that have been trained  to treat common illnesses and complaints. They're usually fairly quick and inexpensive. However, if you have serious medical issues or chronic medical problems, these are probably not your best option ° °STD Testing °- [place] [place], [address], [place], phone [phone] or [phone].  Monday - Friday, call for an appointment. °- [place] [facility] [place], [facility], [address], [place], phone [phone] or [phone].  Monday - Friday, call for an appointment. ° °Abuse/Neglect: °- [place] Child Abuse Hotline (336) 641-3795 °- [place] Child Abuse Hotline 800-378-5315 (After Hours) ° °Emergency Shelter:  [facility] (336) 271-5985 ° °Maternity Homes: °- [facility] (336) 275-9566 °- [facility] (704) 372-4663 ° °MRSA Hotline #:   832-7006 ° °Dental Assistance °If unable to pay or uninsured, contact:  [place] [facility] to become qualified for the adult dental clinic. ° °Patients with Medicaid: [facility] °[address], [phone] °[address], 510-2600 ° °If unable to pay, or uninsured, contact [place] [facility] ([phone] in [place], 842-7733 in [place]) to become qualified for the adult dental clinic ° °[facility] °[address] °[place], [place] [address] °(336) 272-4177 °www.[facility].com ° °Other Low-Cost Community Dental Services: °- [facility]- [address], [place], [place], [address], [phone], [address]. [address], 2nd and 4th Thursday of the month at 6:30am.  10 clients each day by appointment, can sometimes see walk-in patients if someone does not show for an appointment. °- [facility]- [address], [place], [place], [address], [phone] °- [facility]- [address], [place], [place], [address], [phone] °- [place] [facility]- 342-8273 °- [facility]- 703-3100 °- [facility]- 570-6415 ° °     Behavioral Health Resources in the Community ° °Intensive Outpatient Programs: °[facility]      °[address] °[place], [place] °336-878-6098 °Both a day and evening program °      °[facility]     °[address]        °[place], [place] [address] °336-832-9800        ° °[facility] °[address] ° [place] °336-882-2125 ° °[facility] °ACCESS LINE: 1-800-853-5163 or 336-641-4981 °[address] °,  [address] °Http://www.[email or web address].com/services/adult.htm ° ° °Substance Abuse Resources: °- [facility]  336-882-2125 °- [facility] 336-784-9470 °- [facility] 336-285-9073 °- [facility] 336-845-3988 °-   Residential & Outpatient Substance Abuse Program  800-659-3381 ° °Psychological Services: °- [facility]  832-9600 °- [facility]  378-7881 °- [facility], [address], [place], ACCESS LINE: 1-800-853-5163 or 336-641-4981, Http://www.[facility].com/services/adult.htm ° °Mobile Crisis Teams:         °                               °[facility]         °Mobile Crisis Care Unit °1-877-626-1772       °      °Assertive °[facility] °[address] [place] °336-834-9664 °                                        °Interventionist °Sharon DeEsch °[address] °[place] °336-554-5454 ° °Self-Help/Support Groups: °[facility] Variety of support groups °373-1402 (call for more info) ° °Narcotics Anonymous (NA) °[facility] °[address] °[place] - 2 meetings at this location ° °Residential Treatment Programs:  °[facility]      °[address]        °[place]       °866-801-8205        ° °[facility] °[address] °[place], [place]  [address] °704-293-8524 ° °[facility]  °[address] °[place], [place] [address] °336-845-3988 °Admissions: 8am-3pm M-F ° °[facility]     °[address]        °[place], [place] [address]       °336-841-1104        ° °[facility] °[address] °[place], [place] °336-379-7146 ° °[facility] °[address] °[place], [place] °336-285-9073 ° °[facility] - Intensive Outpatient      °[address]     °[place], [place]       °852-3033        ° °[facility]     °[address] °[place], [place] °877-615-2722 or 336-784-9470 ° °[facility] ([facility]), Medicaid °[address] °[place],  °336-227-7417 ° °[facility]                                               °[address] °  °800-659-3381 ° °[place] [facility]: °[facility]- 1-888-581-9988              ° °General Therapy                                                °Julie Brannon, PhD        °[address]                                       °[place],  [address]         °336-349-5553   °Insurance ° °[facility]   °[address] °[place], [place] [address] °336-349-4454 ° °[facility] °[address] [place], [place] [address] °336-342-8316 °Insurance/Medicaid/sponsorship through [facility] ° °[facility]                                              °[address]                                        °[place], [place] [address]    °Therapy/tele-psych/case         °336-342-8316        °  °[facility] °[address]  ° [place], [place]  [address]  °Adolescent/group home/case management °336-349-2233  °                                         °Julia Brannon PhD       °General therapy       °Insurance   °336-951-0000        ° °Dr. Arfeen, Insurance, M-F °336- 349-4544 ° °[facility] [place]  [facility]  [employer] [place] [facility] °[address]                 [address]         [address]  °[place]                                               [place]                              [place] °Phone:  349-3220                                  Phone:  [phone]                   Phone:  342-8140 ° °[facility], 342-8316 °- [place] [facility] - [facility]- 1-888-581-9988 °      -     [facility] in [place], [address], °            336-349-4454, Insurance ° °[place] Child Abuse Hotline °(336) 342-1394 or (336) 342-3537 (After Hours) ° ° °

## 2014-07-26 ENCOUNTER — Encounter: Payer: Self-pay | Admitting: Family Medicine

## 2014-07-26 ENCOUNTER — Other Ambulatory Visit: Payer: Self-pay | Admitting: Family Medicine

## 2014-07-26 ENCOUNTER — Ambulatory Visit (INDEPENDENT_AMBULATORY_CARE_PROVIDER_SITE_OTHER): Payer: BLUE CROSS/BLUE SHIELD | Admitting: Family Medicine

## 2014-07-26 VITALS — BP 140/80 | HR 79 | Temp 98.8°F | Resp 18 | Ht 76.5 in | Wt 214.6 lb

## 2014-07-26 DIAGNOSIS — F410 Panic disorder [episodic paroxysmal anxiety] without agoraphobia: Secondary | ICD-10-CM

## 2014-07-26 DIAGNOSIS — F329 Major depressive disorder, single episode, unspecified: Secondary | ICD-10-CM

## 2014-07-26 DIAGNOSIS — F418 Other specified anxiety disorders: Secondary | ICD-10-CM

## 2014-07-26 DIAGNOSIS — B1920 Unspecified viral hepatitis C without hepatic coma: Secondary | ICD-10-CM

## 2014-07-26 DIAGNOSIS — F419 Anxiety disorder, unspecified: Secondary | ICD-10-CM

## 2014-07-26 LAB — CBC WITH DIFFERENTIAL/PLATELET
BASOS PCT: 0 % (ref 0–1)
Basophils Absolute: 0 10*3/uL (ref 0.0–0.1)
EOS ABS: 0.2 10*3/uL (ref 0.0–0.7)
Eosinophils Relative: 3 % (ref 0–5)
HEMATOCRIT: 41.4 % (ref 39.0–52.0)
HEMOGLOBIN: 14.3 g/dL (ref 13.0–17.0)
LYMPHS ABS: 1.3 10*3/uL (ref 0.7–4.0)
Lymphocytes Relative: 25 % (ref 12–46)
MCH: 30 pg (ref 26.0–34.0)
MCHC: 34.5 g/dL (ref 30.0–36.0)
MCV: 86.8 fL (ref 78.0–100.0)
MPV: 9.1 fL (ref 8.6–12.4)
Monocytes Absolute: 0.3 10*3/uL (ref 0.1–1.0)
Monocytes Relative: 5 % (ref 3–12)
NEUTROS ABS: 3.5 10*3/uL (ref 1.7–7.7)
NEUTROS PCT: 67 % (ref 43–77)
Platelets: 211 10*3/uL (ref 150–400)
RBC: 4.77 MIL/uL (ref 4.22–5.81)
RDW: 12.6 % (ref 11.5–15.5)
WBC: 5.2 10*3/uL (ref 4.0–10.5)

## 2014-07-26 LAB — HEPATITIS B CORE ANTIBODY, TOTAL: HEP B C TOTAL AB: NONREACTIVE

## 2014-07-26 LAB — PROTIME-INR
INR: 0.95 (ref <1.50)
Prothrombin Time: 12.7 seconds (ref 11.6–15.2)

## 2014-07-26 LAB — HEPATITIS A ANTIBODY, TOTAL: Hep A Total Ab: NONREACTIVE

## 2014-07-26 LAB — COMPREHENSIVE METABOLIC PANEL
ALT: 90 U/L — ABNORMAL HIGH (ref 0–53)
AST: 61 U/L — AB (ref 0–37)
Albumin: 3.7 g/dL (ref 3.5–5.2)
Alkaline Phosphatase: 80 U/L (ref 39–117)
BUN: 9 mg/dL (ref 6–23)
CO2: 27 mEq/L (ref 19–32)
Calcium: 8.7 mg/dL (ref 8.4–10.5)
Chloride: 103 mEq/L (ref 96–112)
Creat: 0.76 mg/dL (ref 0.50–1.35)
Glucose, Bld: 88 mg/dL (ref 70–99)
POTASSIUM: 3.9 meq/L (ref 3.5–5.3)
SODIUM: 139 meq/L (ref 135–145)
Total Bilirubin: 0.5 mg/dL (ref 0.2–1.2)
Total Protein: 6.4 g/dL (ref 6.0–8.3)

## 2014-07-26 LAB — HEPATITIS B SURFACE ANTIBODY,QUALITATIVE

## 2014-07-26 LAB — IRON: Iron: 39 ug/dL — ABNORMAL LOW (ref 42–165)

## 2014-07-26 LAB — HEPATITIS C ANTIBODY: HCV Ab: REACTIVE — AB

## 2014-07-26 LAB — HIV ANTIBODY (ROUTINE TESTING W REFLEX): HIV 1&2 Ab, 4th Generation: NONREACTIVE

## 2014-07-26 LAB — HEPATITIS B SURFACE ANTIBODY, QUANTITATIVE: Hepatitis B-Post: 6.6 m[IU]/mL

## 2014-07-26 LAB — HEPATITIS B SURFACE ANTIGEN: Hepatitis B Surface Ag: NEGATIVE

## 2014-07-26 MED ORDER — TEMAZEPAM 15 MG PO CAPS: 15.0000 mg | ORAL_CAPSULE | Freq: Every day | ORAL | Status: DC

## 2014-07-26 MED ORDER — CLONAZEPAM 1 MG PO TABS: 1.0000 mg | ORAL_TABLET | Freq: Two times a day (BID) | ORAL | Status: DC | PRN

## 2014-07-26 NOTE — Patient Instructions (Signed)
Hepatitis C Hepatitis C is a viral infection of the liver. Infection may go undetected for months or years because symptoms may be absent or very mild. Chronic liver disease is the main danger of hepatitis C. This may lead to scarring of the liver (cirrhosis), liver failure, and liver cancer. CAUSES  Hepatitis C is caused by the hepatitis C virus (HCV). Formerly, hepatitis C infections were most commonly transmitted through blood transfusions. In the early 1990s, routine testing of donated blood for hepatitis C and exclusion of blood that tests positive for HCV began. Now, HCV is most commonly transmitted from person to person through injection drug use, sharing needles, or sex with an infected person. A caregiver may also get the infection from exposure to the blood of an infected patient by way of a cut or needle stick.  SYMPTOMS  Acute Phase Many cases of acute HCV infection are mild and cause few problems.Some people may not even realize they are sick.Symptoms in others may last a few weeks to several months and include:  Feeling very tired.  Loss of appetite.  Nausea.  Vomiting.  Abdominal pain.  Dark yellow urine.  Yellow skin and eyes (jaundice).  Itching of the skin. Chronic Phase  Between 50% to 85% of people who get HCV infection become "chronic carriers." They often have no symptoms, but the virus stays in their body.They may spread the virus to others and can get long-term liver disease.  Many people with chronic HCV infection remain healthy for many years. However, up to 1 in 5 chronically infected people may develop severe liver diseases including scarring of the liver (cirrhosis), liver failure, or liver cancer. DIAGNOSIS  Diagnosis of hepatitis C infection is made by testing blood for the presence of hepatitis C viral particles called RNA. Other tests may also be done to measure the status of current liver function, exclude other liver problems, or assess liver  damage. TREATMENT  Treatment with many antiviral drugs is available and recommended for some patients with chronic HCV infection. Drug treatment is generally considered appropriate for patients who:  Are 65 years of age or older.  Have a positive test for HCV particles in the blood.  Have a liver tissue sample (biopsy) that shows chronic hepatitis and significant scarring (fibrosis).  Do not have signs of liver failure.  Have acceptable blood test results that confirm the wellness of other body organs.  Are willing to be treated and conform to treatment requirements.  Have no other circumstances that would prevent treatment from being recommended (contraindications). All people who are offered and choose to receive drug treatment must understand that careful medical follow up for many months and even years is crucial in order to make successful care possible. The goal of drug treatment is to eliminate any evidence of HCV in the blood on a long-term basis. This is called a "sustained virologic response" or SVR. Achieving a SVR is associated with a decrease in the chance of life-threatening liver problems, need for a liver transplant, liver cancer rates, and liver-related complications. Successful treatment currently requires taking treatment drugs for at least 24 weeks and up to 72 weeks. An injected drug (interferon) given weekly and an oral antiviral medicine taken daily are usually prescribed. Side effects from these drugs are common and some may be very serious. Your response to treatment must be carefully monitored by both you and your caregiver throughout the entire treatment period. PREVENTION There is no vaccine for hepatitis C. The only  way to prevent the disease is to reduce the risk of exposure to the virus.   Avoid sharing drug needles or personal items like toothbrushes, razors, and nail clippers with an infected person.  Healthcare workers need to avoid injuries and wear  appropriate protective equipment such as gloves, gowns, and face masks when performing invasive medical or nursing procedures. HOME CARE INSTRUCTIONS  To avoid making your liver disease worse:  Strictly avoid drinking alcohol.  Carefully review all new prescriptions of medicines with your caregiver. Ask your caregiver which drugs you should avoid. The following drugs are toxic to the liver, and your caregiver may tell you to avoid them:  Isoniazid.  Methyldopa.  Acetaminophen.  Anabolic steroids (muscle-building drugs).  Erythromycin.  Oral contraceptives (birth control pills).  Check with your caregiver to make sure medicine you are currently taking will not be harmful.  Periodic blood tests may be required. Follow your caregiver's advice about when you should have blood tests.  Avoid a sexual relationship until advised otherwise by your caregiver.  Avoid activities that could expose other people to your blood. Examples include sharing a toothbrush, nail clippers, razors, and needles.  Bed rest is not necessary, but it may make you feel better. Recovery time is not related to the amount of rest you receive.  This infection is contagious. Follow your caregiver's instructions in order to avoid spread of the infection. SEEK IMMEDIATE MEDICAL CARE IF:  You have increasing fatigue or weakness.  You have an oral temperature above 102 F (38.9 C), not controlled by medicine.  You develop loss of appetite, nausea, or vomiting.  You develop jaundice.  You develop easy bruising or bleeding.  You develop any severe problems as a result of your treatment. MAKE SURE YOU:   Understand these instructions.  Will watch your condition.  Will get help right away if you are not doing well or get worse. Document Released: 12/26/1999 Document Revised: 03/22/2011 Document Reviewed: 04/11/2013 [facility] Patient Information 2015 [place], [place]. This information is not intended to replace  advice given to you by your health care provider. Make sure you discuss any questions you have with your health care provider. Hepatitis C This is a test to determine if you have contracted the hepatitis C virus (HCV) and to monitor treatment of the infection. Hepatitis C is a virus that can infect and damage the liver. Hepatitis C antibody is produced in the body in response to exposure to the hepatitis C virus (HCV). The most common test for HCV looks for these antibodies in your blood. Other tests detect the presence of viral RNA, the amount of viral RNA present, or determine the specific subtype of virus. Hepatitis C often leads to chronic hepatitis, which can progress to cirrhosis and liver cancer (hepatocellular carcinoma). Early detection of the virus can alert your doctor to follow your liver function more closely than usual and to consider treating you if you are chronically infected. Anti-HCV tests detect the presence of antibodies to the virus, indicating exposure to HCV. These tests cannot tell if you still have an active viral infection, only that you were exposed to the virus in the past. Usually, the test is reported as "positive" or "negative." There is some evidence that, if your test is "weakly positive," it may not mean that you have been exposed to the HCV virus. The Centers for Disease Control and Prevention (CDC) revised its guidelines in 2003 and suggests that weakly positive tests be confirmed with the next  test before being reported.  HCV RIBA test is an additional test to confirm the presence of antibodies to the virus. In most cases, it can tell if the positive anti-HCV test was due to exposure to HCV (positive RIBA) or represents a false signal (negative RIBA). In a few cases, the results cannot answer this question (indeterminate RIBA). Like the anti-HCV test, the RIBA test cannot tell if you are currently infected, only that you have been exposed to the virus.  HCV-RNA test  identifies whether the virus is in your blood, indicating that you have an active infection with HCV. In the past, it was usually performed by a test called a qualitative HCV. Qualitative HCV RNA is reported as a "positive" or "detected" if any HCV viral RNA is found; otherwise, the report will be "negative" or "not detected". The test may also be used after treatment to see if the virus has been eliminated from the body.  Viral Load or Quantitative HCV tests measure the number of viral RNA particles in your blood. Viral load tests are often used before and during treatment to help determine response to treatment by comparing the amount of virus before and after treatment (usually after 3 months); successful treatment causes a decrease of 99% or more (2 logs) in viral load soon after starting treatment (as early as 4-12 weeks), and usually leads to viral load being not detected. Some newer viral load tests can detect very low amounts of viral RNA, and some laboratories no longer do qualitative HCV RNA tests if they use one of these versions of viral load testing.  Viral genotyping is used to determine the kind, or genotype, of the virus present. There are 6 major types of HCV; the most common (genotype 1) is less likely to respond to treatment than genotypes 2 or 3 and usually requires longer therapy (48 weeks, versus 24 weeks for genotype 2 or 3). Genotyping is often ordered before treatment is started to give an idea of the likelihood of success and how long treatment may be needed.  PREPARATION FOR TEST A blood sample is obtained by inserting a needle into a vein in the arm. NORMAL FINDINGS This test is normally negative. Ranges for normal findings may vary among different laboratories and hospitals. You should always check with your doctor after having lab work or other tests done to discuss the meaning of your test results and whether your values are considered within normal limits. MEANING OF TEST   Your caregiver will go over the test results with you and discuss the importance and meaning of your results, as well as treatment options and the need for additional tests if necessary. OBTAINING THE TEST RESULTS It is your responsibility to obtain your test results. Ask the lab or department performing the test when and how you will get your results. Document Released: 01/31/2004 Document Revised: 03/22/2011 Document Reviewed: 03/30/2013 [facility] Patient Information 2015 [place], [place]. This information is not intended to replace advice given to you by your health care provider. Make sure you discuss any questions you have with your health care provider.

## 2014-07-26 NOTE — Progress Notes (Addendum)
Subjective:  This chart was scribed for Norberto Sorenson, MD by Andrew Au, ED Scribe. This patient was seen in room 22 and the patient's care was started at 9:29 AM.   Patient ID: Samuel Andrade, male    DOB: Nov 14, 1980, 34 y.o.   MRN: [id number]  HPI Chief Complaint  Patient presents with  . Follow-up    PT WANTS TO DISCUSS TEST RESULTS.   HPI Comments: Samuel Andrade is a 34 y.o. male who presents to the [facility] to discuss lab results.   Pt had STD testing a [facility] and is concerned about hepatitis results. Thinks he was immunized againt hep A and B as a child but is unsure. He has been concerned about positive hep c test but has also been worried about other life stressors that have worsened recently. Physically he feels well. He denies fever, chills, fatigue, visual disturbances , nausea, emesis, bladder and bowel, abdominal pain, CP, SOB and skin changes.   Anxiety Pt last seen at our office 2 wks ago by my colleague Dr. Conley Rolls. He was restarted on his zoloft 50 qd and clonazepam 1 bid at that time which he had been off of for 1 mo since he stopped seeing his therapist. He did have an appt last wk w/ a new psychiatrist.  Pt taking clonazepam twice a day but has noticed increased anxiety towards the end of the first dose, but does not make him sleepy. He has never used anything for sleep other that trazodone while in the hospital. States it work but gave him vivid dreams. At night, he lays in bed worrying. Uses melatonin occ. Tries to go to sleep at 12am but does not fall asleep until 4am and wakes up at 8 am. Pt exercises daily. Feels that his physical energy is fine but his mental energy is to low.  Past Medical History  Diagnosis Date  . Anxiety   . Depression   . Neuromuscular disorder    Past Surgical History  Procedure Laterality Date  . Hernia repair    . Spine surgery    . Fracture surgery     Prior to Admission medications   Medication Sig Start Date End  Date Taking? Authorizing Provider  clonazePAM (KLONOPIN) 1 MG tablet Take 1 tablet (1 mg total) by mouth 2 (two) times daily as needed for anxiety. 07/11/14  Yes Thao P Le, DO  sertraline (ZOLOFT) 50 MG tablet Take 1 tablet (50 mg total) by mouth daily. 07/11/14  Yes Thao P Le, DO  cyclobenzaprine (FLEXERIL) 10 MG tablet Take 1 tablet (10 mg total) by mouth 3 (three) times daily as needed. Patient not taking: Reported on 01/17/2014 05/16/12   Thao P Le, DO   Review of Systems  Constitutional: Negative for fever, chills and fatigue.  Eyes: Negative for visual disturbance.  Respiratory: Negative for shortness of breath.   Cardiovascular: Negative for chest pain.  Gastrointestinal: Negative for nausea, vomiting, abdominal pain, diarrhea, constipation and blood in stool.  Genitourinary: Negative for dysuria, hematuria and difficulty urinating.  Musculoskeletal: Negative for myalgias and arthralgias.  Skin: Negative for color change and rash.  Psychiatric/Behavioral: The patient is nervous/anxious.    Objective:   Physical Exam  Constitutional: He is oriented to person, place, and time. He appears well-developed and well-nourished. No distress.  HENT:  Head: Normocephalic and atraumatic.  Eyes: Conjunctivae and EOM are normal.  Neck: Neck supple.  Cardiovascular: Normal rate.   Pulmonary/Chest: Effort  normal.  Musculoskeletal: Normal range of motion.  Neurological: He is alert and oriented to person, place, and time.  Skin: Skin is warm and dry.  Psychiatric: He has a normal mood and affect. His behavior is normal.  Nursing note and vitals reviewed.  Filed Vitals:   07/26/14 0921  BP: 140/80  Pulse: 79  Temp: 98.8 F (37.1 C)  Resp: 18   Assessment & Plan:   1. Hepatitis C virus infection without hepatic coma, unspecified chronicity - new diagnosis found on routine std screening that pt had done at a private lab without seeing a doctor - he brought the labs results with him for review  and a copy has been scanned into the chart. Pt asymptomatic.  Recheck labs to ensure no error and get liver US.  Refer to hep C clinic as pt would be excellent candidate for treatment.  Pt advised to start Hep A and B vaccine courses today as he does not know if he received prior but pt declines - will wait for labs to confirm prior vaccination status before receiving.  Pt is not immune to Hep A and is borderline on Hep B so rec he receive full courses of both. No alcohol or tylenol. Low fat diet, keep exercising.  2. Panic attack   3. Anxiety and depression - doing better since restarting zoloft and klonopin bid but still not sleeping at all has a anxiety peak late afternoon when morning dose of klonopin is wearing off so try taking klonopin second dose of klonopin in mid afternoon then will use a different med qhs for sleep - failed trazodone (vivid dreams) so try restoril - warned of interactions.    Orders Placed This Encounter  Procedures  . US Abdomen Complete    Standing Status: Future     Number of Occurrences:      Standing Expiration Date: 09/26/2015    Order Specific Question:  Reason for Exam (SYMPTOM  OR DIAGNOSIS REQUIRED)    Answer:  new diagnosis of hepatitis C    Order Specific Question:  Preferred imaging location?    Answer:  [facility]  . Hepatitis B surface antibody  . Hepatitis C Ab Reflex HCV RNA, QUANT  . Hepatitis A antibody, total  . Hepatitis B core antibody, total  . Hepatitis B surface antibody  . Hepatitis B surface antigen  . Hepatitis C genotype  . HIV antibody  . ANA  . CBC with Differential  . Comprehensive metabolic panel  . Protime-INR  . Iron  . Hepatitis C RNA quantitative  . AMB referral to hepatitis C clinic    Referral Priority:  Routine    Referral Type:  Consultation    Number of Visits Requested:  1    Meds ordered this encounter  Medications  . temazepam (RESTORIL) 15 MG capsule    Sig: Take 1-2 capsules (15-30 mg total) by  mouth at bedtime.    Dispense:  60 capsule    Refill:  0  . clonazePAM (KLONOPIN) 1 MG tablet    Sig: Take 1 tablet (1 mg total) by mouth 2 (two) times daily as needed for anxiety.    Dispense:  60 tablet    Refill:  0    May fill on or after 08/09/14    I personally performed the services described in this documentation, which was scribed in my presence. The recorded information has been reviewed and considered, and addended by me as needed.  Carley HammedEva  Brigitte Pulse, MD MPH

## 2014-07-26 NOTE — Progress Notes (Signed)
Pt last seen at our office 2 wks ago by my colleague Dr. Conley RollsLe. He was restarted on his zoloft 50 qd and clonazepam 1 bid at that time which he had been off of for 1 mo since he stopped seeing his therapist. He did have an appt last wk w/ a new psychiatrist.  He remembers trying trazodone in the hospital and remembers having very strong dreams on it.    Uses melatonin some.  Average energy, getting plenty of exercise.  Physical energy is good but mental energy is low.l

## 2014-07-29 ENCOUNTER — Encounter: Payer: Self-pay | Admitting: Family Medicine

## 2014-07-29 LAB — ANA: Anti Nuclear Antibody(ANA): NEGATIVE

## 2014-07-29 LAB — HEPATITIS C RNA QUANTITATIVE
HCV QUANT: 6076591 [IU]/mL — AB (ref <15)
HCV Quantitative Log: 6.78 {Log} — ABNORMAL HIGH (ref <1.18)

## 2014-07-30 LAB — HEPATITIS C RNA QUANTITATIVE
HCV QUANT LOG: 6.53 {Log} — AB (ref <1.18)
HCV Quantitative: 3408766 IU/mL — ABNORMAL HIGH (ref <15)

## 2014-07-30 LAB — HEPATITIS C GENOTYPE: HCV Genotype: 3

## 2014-07-31 ENCOUNTER — Telehealth: Payer: Self-pay | Admitting: Family Medicine

## 2014-07-31 ENCOUNTER — Encounter: Payer: Self-pay | Admitting: Family Medicine

## 2014-07-31 DIAGNOSIS — F32A Depression, unspecified: Secondary | ICD-10-CM | POA: Insufficient documentation

## 2014-07-31 DIAGNOSIS — F1910 Other psychoactive substance abuse, uncomplicated: Secondary | ICD-10-CM | POA: Insufficient documentation

## 2014-07-31 DIAGNOSIS — F411 Generalized anxiety disorder: Secondary | ICD-10-CM | POA: Insufficient documentation

## 2014-07-31 DIAGNOSIS — F329 Major depressive disorder, single episode, unspecified: Secondary | ICD-10-CM

## 2014-07-31 NOTE — Telephone Encounter (Signed)
LM to call me back. Any clinical staff can answer this call. Basically our office is unwilling to write him anymore clonazepam for his anxiety, he has a prescription for ZOLOFT for the next  5 months, he can come and see us for zoloft recheck and also restoril recheck but no narcotics and benzos from our office.   I recently saw him and obtained records from the [facility] and due to the complexity of his illness with anxiety, depression, and history of substance abuse which was not openly divulged to me during our office visit even though it was clearly asked, our office is no longer able to prescribe you any clonazepam. Dr Clelia CroftShaw recently saw him on 07/26/14 and he was given a prescription for clonazepam to be filled on 08/09/14  And I would like him to start tapering it off as follows: Clonazepam 1 mg 1/2 tablet by mouth twice daily as needed x 1 week  and then 1/2 tablet by mouth every other day until finished. We can refill zoloft and/or restoril but we will no longer being giving him Clonazepam. I have sent  Him a letter with several different resource options.

## 2014-08-05 ENCOUNTER — Telehealth: Payer: Self-pay

## 2014-08-05 NOTE — Telephone Encounter (Signed)
Pt missed his CB from Dr. Conley Rolls and would like another. Please advise at [phone]

## 2014-08-06 NOTE — Telephone Encounter (Signed)
Spoke to pt. I told him we can refill his Zoloft and Restoril, but we will not be prescribing anymore narcotics or benzos. Pt was understanding. I advised him you would like him to taper off the Clonazepam. I gave him your instructions for doing so. Also informed him we sent a letter with different resource options.

## 2014-08-06 NOTE — Telephone Encounter (Signed)
Attempted to call pt. Left VM to call back.  Lenell Antu, DO at 07/31/2014 4:54 PM     Status: Signed       Expand All Collapse All   LM to call me back. Any clinical staff can answer this call. Basically our office is unwilling to write him anymore clonazepam for his anxiety, he has a prescription for ZOLOFT for the next 5 months, he can come and see [place] for zoloft recheck and also restoril recheck but no narcotics and benzos from our office.   I recently saw him and obtained records from the [facility] and due to the complexity of his illness with anxiety, depression, and history of substance abuse which was not openly divulged to me during our office visit even though it was clearly asked, our office is no longer able to prescribe you any clonazepam. Dr Clelia Croft recently saw him on 07/26/14 and he was given a prescription for clonazepam to be filled on 08/09/14 And I would like him to start tapering it off as follows: Clonazepam 1 mg 1/2 tablet by mouth twice daily as needed x 1 week and then 1/2 tablet by mouth every other day until finished. We can refill zoloft and/or restoril but we will no longer being giving him Clonazepam. I have sent Him a letter with several different resource options.

## 2014-08-16 ENCOUNTER — Ambulatory Visit
Admission: RE | Admit: 2014-08-16 | Discharge: 2014-08-16 | Disposition: A | Discharge status: Home / Self Care | Payer: BLUE CROSS/BLUE SHIELD | Source: Ambulatory Visit | Attending: Family Medicine | Admitting: Family Medicine

## 2014-08-16 DIAGNOSIS — R161 Splenomegaly, not elsewhere classified: Secondary | ICD-10-CM

## 2014-08-16 DIAGNOSIS — B1920 Unspecified viral hepatitis C without hepatic coma: Secondary | ICD-10-CM

## 2014-08-16 DIAGNOSIS — Z720 Tobacco use: Secondary | ICD-10-CM

## 2014-08-19 ENCOUNTER — Encounter: Payer: Self-pay | Admitting: Family Medicine

## 2014-08-19 DIAGNOSIS — B1920 Unspecified viral hepatitis C without hepatic coma: Secondary | ICD-10-CM

## 2014-08-19 DIAGNOSIS — Z8619 Personal history of other infectious and parasitic diseases: Secondary | ICD-10-CM | POA: Insufficient documentation

## 2014-08-19 DIAGNOSIS — R161 Splenomegaly, not elsewhere classified: Secondary | ICD-10-CM | POA: Insufficient documentation

## 2014-08-19 DIAGNOSIS — Z720 Tobacco use: Secondary | ICD-10-CM | POA: Insufficient documentation

## 2014-08-26 ENCOUNTER — Other Ambulatory Visit: Payer: Self-pay | Admitting: Family Medicine

## 2014-09-26 ENCOUNTER — Ambulatory Visit: Payer: BLUE CROSS/BLUE SHIELD | Admitting: Family Medicine

## 2014-09-27 ENCOUNTER — Ambulatory Visit: Payer: BLUE CROSS/BLUE SHIELD | Admitting: Family Medicine

## 2014-10-29 ENCOUNTER — Other Ambulatory Visit (HOSPITAL_COMMUNITY): Payer: Self-pay | Admitting: Nurse Practitioner

## 2014-10-29 DIAGNOSIS — B182 Chronic viral hepatitis C: Secondary | ICD-10-CM

## 2014-11-13 ENCOUNTER — Telehealth (HOSPITAL_COMMUNITY): Payer: Self-pay

## 2014-11-13 NOTE — Telephone Encounter (Signed)
Called pt to remind [facility], left message for pt to call back. AW

## 2014-11-14 ENCOUNTER — Ambulatory Visit (HOSPITAL_COMMUNITY)
Admission: RE | Admit: 2014-11-14 | Discharge: 2014-11-14 | Disposition: A | Discharge status: Home / Self Care | Payer: BLUE CROSS/BLUE SHIELD | Source: Ambulatory Visit | Attending: Nurse Practitioner | Admitting: Nurse Practitioner

## 2014-11-14 DIAGNOSIS — N281 Cyst of kidney, acquired: Secondary | ICD-10-CM | POA: Diagnosis not present

## 2014-11-14 DIAGNOSIS — B182 Chronic viral hepatitis C: Secondary | ICD-10-CM | POA: Diagnosis not present

## 2014-11-14 DIAGNOSIS — R161 Splenomegaly, not elsewhere classified: Secondary | ICD-10-CM | POA: Insufficient documentation

## 2015-05-14 ENCOUNTER — Other Ambulatory Visit: Payer: Self-pay | Admitting: Nurse Practitioner

## 2015-05-14 DIAGNOSIS — N281 Cyst of kidney, acquired: Secondary | ICD-10-CM

## 2015-05-23 ENCOUNTER — Ambulatory Visit
Admission: RE | Admit: 2015-05-23 | Discharge: 2015-05-23 | Disposition: A | Discharge status: Home / Self Care | Payer: BLUE CROSS/BLUE SHIELD | Source: Ambulatory Visit | Attending: Nurse Practitioner | Admitting: Nurse Practitioner

## 2015-05-23 DIAGNOSIS — N281 Cyst of kidney, acquired: Secondary | ICD-10-CM

## 2015-10-28 ENCOUNTER — Telehealth: Payer: Self-pay | Admitting: *Deleted

## 2015-10-28 ENCOUNTER — Encounter: Payer: Self-pay | Admitting: *Deleted

## 2015-10-28 NOTE — Telephone Encounter (Signed)
Called to offer the patient a Hep C appointment.  No answer and no voice mail.  Will mail the patient a letter.

## 2015-12-17 ENCOUNTER — Emergency Department (HOSPITAL_COMMUNITY)
Admission: EM | Admit: 2015-12-17 | Discharge: 2015-12-17 | Disposition: A | Discharge status: Home / Self Care | Payer: BLUE CROSS/BLUE SHIELD | Attending: Emergency Medicine | Admitting: Emergency Medicine

## 2015-12-17 ENCOUNTER — Encounter (HOSPITAL_COMMUNITY): Payer: Self-pay | Admitting: Emergency Medicine

## 2015-12-17 DIAGNOSIS — F17210 Nicotine dependence, cigarettes, uncomplicated: Secondary | ICD-10-CM | POA: Diagnosis not present

## 2015-12-17 DIAGNOSIS — F1110 Opioid abuse, uncomplicated: Secondary | ICD-10-CM | POA: Diagnosis not present

## 2015-12-17 DIAGNOSIS — T401X1A Poisoning by heroin, accidental (unintentional), initial encounter: Secondary | ICD-10-CM | POA: Diagnosis not present

## 2015-12-17 DIAGNOSIS — Z79899 Other long term (current) drug therapy: Secondary | ICD-10-CM | POA: Insufficient documentation

## 2015-12-17 NOTE — ED Notes (Signed)
Security has 3 knives locked up.

## 2015-12-17 NOTE — ED Triage Notes (Signed)
Per EMS, overdose on heroin and Xanax in [facility]-fire department gave him 2 mg of Narcan intranasal-was found apneic on floor-denies pain-police gave him a choice to come here or be arrested

## 2015-12-17 NOTE — ED Notes (Signed)
Pt is pacing in the room, appears anxious.  States he made a mistake and he understands.  But he reports that he does not need to be here and that there are patients who are sicker that he is that needs the room.  Pt made aware that d/t the nature of his visit to the ED, he is to be monitored to ensure stability.  He's made aware that per the EDP he is to be monitored for 30 minutes.  Pt agreed.  Pt ambulated to the BR with steady gait.

## 2015-12-21 NOTE — ED Provider Notes (Signed)
[facility]-EMERGENCY DEPT Provider Note   CSN: [id number] Arrival date & time: 12/17/15  1225     History   Chief Complaint Chief Complaint  Patient presents with  . heroin overdose    HPI Zandra AbtsJohn E Granier is a 35 y.o. male.  HPI Pt comes in with cc of OD. Per EMS, pt was unresponsive when they arrived and required 2 mg IN narcan. Pt arrives to the ER alert. Reports that he usually abuses benzo, but his friend provided him with heroine as well. Pt reports that he snorted heroine. Pt is anxious. He denies si/hi/psychoses. Pt is unhappy about the mistake. He doesn't think he is ready to quit yet. Pt wants to leave the ER immediately, but he has agreed to wait and be monitored for at least 2 hours post narcan. Pt is aox3 and understands that he can die with heroine abuse.  Past Medical History:  Diagnosis Date  . Anxiety   . Depression   . Neuromuscular disorder [facility](HCC)     Patient Active Problem List   Diagnosis Date Noted  . Hepatitis C infection 08/19/2014  . Tobacco abuse 08/19/2014  . Splenomegaly 08/19/2014  . Substance abuse 07/31/2014  . Depression 07/31/2014  . Anxiety state 07/31/2014    Past Surgical History:  Procedure Laterality Date  . FRACTURE SURGERY    . HERNIA REPAIR    . SPINE SURGERY         Home Medications    Prior to Admission medications   Medication Sig Start Date End Date Taking? Authorizing Provider  clonazePAM (KLONOPIN) 1 MG tablet Take 1 tablet (1 mg total) by mouth 2 (two) times daily as needed for anxiety. 07/26/14   Sherren MochaEva N Shaw, MD  sertraline (ZOLOFT) 50 MG tablet Take 1 tablet (50 mg total) by mouth daily. 07/11/14   Thao P Le, DO  temazepam (RESTORIL) 15 MG capsule Take 1-2 capsules (15-30 mg total) by mouth at bedtime. 07/26/14   Sherren MochaEva N Shaw, MD    Family History Family History  Problem Relation Age of Onset  . Hypertension Father   . Stroke Father   . Stroke Maternal Grandmother   . Mental illness Maternal Grandmother   . Stroke  Maternal Grandfather   . Heart disease Maternal Grandfather   . Stroke Paternal Grandmother   . Diabetes Paternal Grandfather     Social History Social History  Substance Use Topics  . Smoking status: Current Every Day Smoker    Packs/day: 0.50    Years: 9.00    Types: Cigarettes  . Smokeless tobacco: Not on file  . Alcohol use Yes     Comment: once a week     Allergies   Patient has no known allergies.   Review of Systems Review of Systems  ROS 10 Systems reviewed and are negative for acute change except as noted in the HPI.     Physical Exam Updated Vital Signs BP 148/87 (BP Location: Left Arm)   Pulse 97   Temp 98.3 F (36.8 C) (Oral)   Resp 18   SpO2 100%   Physical Exam  Constitutional: He is oriented to person, place, and time. He appears well-developed and well-nourished.  HENT:  Head: Normocephalic and atraumatic.  Eyes: EOM are normal. Pupils are equal, round, and reactive to light.  3 and equal  Neck: Neck supple. No JVD present.  Cardiovascular: Normal rate and regular rhythm.   Pulmonary/Chest: Effort normal and breath sounds normal. No respiratory distress. He  has no wheezes.  Abdominal: Soft. Bowel sounds are normal. He exhibits no distension. There is no tenderness. There is no rebound and no guarding.  Neurological: He is alert and oriented to person, place, and time. No cranial nerve deficit. Coordination normal.  NIHSS - 0 No objective sensory deficits, Motor strength upper and lower extremity 4+ and equal Normal cerebellar exam  Skin: Skin is warm and dry.  Nursing note and vitals reviewed.    ED Treatments / Results  Labs (all labs ordered are listed, but only abnormal results are displayed) Labs Reviewed - No data to display  EKG  EKG Interpretation None       Radiology No results found.  Procedures Procedures (including critical care time)  Medications Ordered in ED Medications - No data to display   Initial  Impression / Assessment and Plan / ED Course  I have reviewed the triage vital signs and the nursing notes.  Pertinent labs & imaging results that were available during my care of the patient were reviewed by me and considered in my medical decision making (see chart for details).  Clinical Course     Pt comes in post heroine overdose. Also reports using xanax. Pt responded to intranasal heroine on the field.  We will work on d/c once he has been monitored at least 2 hours in the ER. Pt is aox3.  Final Clinical Impressions(s) / ED Diagnoses   Final diagnoses:  Opioid abuse    New Prescriptions Discharge Medication List as of 12/17/2015  2:37 PM       Derwood KaplanAnkit Rohin Krejci, MD 12/21/15 1554

## 2016-03-02 DIAGNOSIS — N200 Calculus of kidney: Secondary | ICD-10-CM | POA: Diagnosis not present

## 2016-05-21 ENCOUNTER — Other Ambulatory Visit: Payer: Self-pay | Admitting: Urology

## 2016-05-21 DIAGNOSIS — N281 Cyst of kidney, acquired: Secondary | ICD-10-CM

## 2016-06-11 ENCOUNTER — Ambulatory Visit
Admission: RE | Admit: 2016-06-11 | Discharge: 2016-06-11 | Disposition: A | Discharge status: Home / Self Care | Payer: BLUE CROSS/BLUE SHIELD | Source: Ambulatory Visit | Attending: Urology | Admitting: Urology

## 2016-06-11 DIAGNOSIS — N281 Cyst of kidney, acquired: Secondary | ICD-10-CM

## 2016-06-11 DIAGNOSIS — N200 Calculus of kidney: Secondary | ICD-10-CM | POA: Diagnosis not present

## 2016-06-11 MED ORDER — GADOBENATE DIMEGLUMINE 529 MG/ML IV SOLN
20.0000 mL | Freq: Once | INTRAVENOUS | Status: AC | PRN
  Administered 2016-06-11: 20 mL via INTRAVENOUS

## 2016-06-24 DIAGNOSIS — B9561 Methicillin susceptible Staphylococcus aureus infection as the cause of diseases classified elsewhere: Secondary | ICD-10-CM | POA: Diagnosis not present

## 2016-06-24 DIAGNOSIS — N390 Urinary tract infection, site not specified: Secondary | ICD-10-CM | POA: Diagnosis not present

## 2016-06-24 DIAGNOSIS — R8271 Bacteriuria: Secondary | ICD-10-CM | POA: Diagnosis not present

## 2017-03-18 DIAGNOSIS — F29 Unspecified psychosis not due to a substance or known physiological condition: Secondary | ICD-10-CM | POA: Diagnosis not present

## 2017-03-18 DIAGNOSIS — F1110 Opioid abuse, uncomplicated: Secondary | ICD-10-CM | POA: Diagnosis not present

## 2017-03-18 DIAGNOSIS — F1310 Sedative, hypnotic or anxiolytic abuse, uncomplicated: Secondary | ICD-10-CM | POA: Diagnosis not present

## 2017-03-18 DIAGNOSIS — T50904A Poisoning by unspecified drugs, medicaments and biological substances, undetermined, initial encounter: Secondary | ICD-10-CM | POA: Diagnosis not present

## 2017-03-18 DIAGNOSIS — F329 Major depressive disorder, single episode, unspecified: Secondary | ICD-10-CM | POA: Diagnosis not present

## 2017-03-30 DIAGNOSIS — X58XXXA Exposure to other specified factors, initial encounter: Secondary | ICD-10-CM | POA: Diagnosis not present

## 2017-03-30 DIAGNOSIS — T402X1A Poisoning by other opioids, accidental (unintentional), initial encounter: Secondary | ICD-10-CM | POA: Diagnosis not present

## 2017-03-30 DIAGNOSIS — Y999 Unspecified external cause status: Secondary | ICD-10-CM | POA: Diagnosis not present

## 2017-03-30 DIAGNOSIS — T50904A Poisoning by unspecified drugs, medicaments and biological substances, undetermined, initial encounter: Secondary | ICD-10-CM | POA: Diagnosis not present

## 2017-05-06 DIAGNOSIS — F3181 Bipolar II disorder: Secondary | ICD-10-CM | POA: Diagnosis not present

## 2017-05-06 DIAGNOSIS — F411 Generalized anxiety disorder: Secondary | ICD-10-CM | POA: Diagnosis not present

## 2017-05-06 DIAGNOSIS — F1410 Cocaine abuse, uncomplicated: Secondary | ICD-10-CM | POA: Diagnosis not present

## 2017-05-06 DIAGNOSIS — F12180 Cannabis abuse with cannabis-induced anxiety disorder: Secondary | ICD-10-CM | POA: Diagnosis not present

## 2017-05-13 DIAGNOSIS — F411 Generalized anxiety disorder: Secondary | ICD-10-CM | POA: Diagnosis not present

## 2017-05-13 DIAGNOSIS — F3181 Bipolar II disorder: Secondary | ICD-10-CM | POA: Diagnosis not present

## 2017-05-13 DIAGNOSIS — F1410 Cocaine abuse, uncomplicated: Secondary | ICD-10-CM | POA: Diagnosis not present

## 2017-05-13 DIAGNOSIS — F12180 Cannabis abuse with cannabis-induced anxiety disorder: Secondary | ICD-10-CM | POA: Diagnosis not present

## 2017-05-20 DIAGNOSIS — F1410 Cocaine abuse, uncomplicated: Secondary | ICD-10-CM | POA: Diagnosis not present

## 2017-05-20 DIAGNOSIS — F3181 Bipolar II disorder: Secondary | ICD-10-CM | POA: Diagnosis not present

## 2017-05-20 DIAGNOSIS — F411 Generalized anxiety disorder: Secondary | ICD-10-CM | POA: Diagnosis not present

## 2017-05-20 DIAGNOSIS — F12180 Cannabis abuse with cannabis-induced anxiety disorder: Secondary | ICD-10-CM | POA: Diagnosis not present

## 2017-06-10 DIAGNOSIS — F1410 Cocaine abuse, uncomplicated: Secondary | ICD-10-CM | POA: Diagnosis not present

## 2017-06-10 DIAGNOSIS — F411 Generalized anxiety disorder: Secondary | ICD-10-CM | POA: Diagnosis not present

## 2017-06-10 DIAGNOSIS — F12180 Cannabis abuse with cannabis-induced anxiety disorder: Secondary | ICD-10-CM | POA: Diagnosis not present

## 2017-06-10 DIAGNOSIS — F3181 Bipolar II disorder: Secondary | ICD-10-CM | POA: Diagnosis not present

## 2017-06-24 DIAGNOSIS — F12180 Cannabis abuse with cannabis-induced anxiety disorder: Secondary | ICD-10-CM | POA: Diagnosis not present

## 2017-06-24 DIAGNOSIS — F1410 Cocaine abuse, uncomplicated: Secondary | ICD-10-CM | POA: Diagnosis not present

## 2017-06-24 DIAGNOSIS — F411 Generalized anxiety disorder: Secondary | ICD-10-CM | POA: Diagnosis not present

## 2017-06-24 DIAGNOSIS — F3181 Bipolar II disorder: Secondary | ICD-10-CM | POA: Diagnosis not present

## 2017-07-01 DIAGNOSIS — F3181 Bipolar II disorder: Secondary | ICD-10-CM | POA: Diagnosis not present

## 2017-07-01 DIAGNOSIS — F411 Generalized anxiety disorder: Secondary | ICD-10-CM | POA: Diagnosis not present

## 2017-07-01 DIAGNOSIS — F1410 Cocaine abuse, uncomplicated: Secondary | ICD-10-CM | POA: Diagnosis not present

## 2017-07-01 DIAGNOSIS — F12180 Cannabis abuse with cannabis-induced anxiety disorder: Secondary | ICD-10-CM | POA: Diagnosis not present

## 2017-07-08 DIAGNOSIS — X58XXXA Exposure to other specified factors, initial encounter: Secondary | ICD-10-CM | POA: Diagnosis not present

## 2017-07-08 DIAGNOSIS — T887XXA Unspecified adverse effect of drug or medicament, initial encounter: Secondary | ICD-10-CM | POA: Diagnosis not present

## 2017-07-08 DIAGNOSIS — T50904A Poisoning by unspecified drugs, medicaments and biological substances, undetermined, initial encounter: Secondary | ICD-10-CM | POA: Diagnosis not present

## 2017-07-08 DIAGNOSIS — T402X1A Poisoning by other opioids, accidental (unintentional), initial encounter: Secondary | ICD-10-CM | POA: Diagnosis not present

## 2017-07-08 DIAGNOSIS — Y999 Unspecified external cause status: Secondary | ICD-10-CM | POA: Diagnosis not present

## 2017-07-08 DIAGNOSIS — R4020 Unspecified coma: Secondary | ICD-10-CM | POA: Diagnosis not present

## 2017-09-22 DIAGNOSIS — S93492A Sprain of other ligament of left ankle, initial encounter: Secondary | ICD-10-CM | POA: Diagnosis not present

## 2017-09-30 DIAGNOSIS — S93492D Sprain of other ligament of left ankle, subsequent encounter: Secondary | ICD-10-CM | POA: Diagnosis not present

## 2017-10-13 ENCOUNTER — Encounter: Payer: BLUE CROSS/BLUE SHIELD | Admitting: Family

## 2017-11-14 ENCOUNTER — Ambulatory Visit: Payer: BLUE CROSS/BLUE SHIELD | Admitting: Family Medicine

## 2017-11-14 ENCOUNTER — Encounter: Payer: Self-pay | Admitting: Family Medicine

## 2017-11-14 VITALS — BP 120/80 | HR 85 | Ht 76.0 in | Wt 212.0 lb

## 2017-11-14 DIAGNOSIS — B1920 Unspecified viral hepatitis C without hepatic coma: Secondary | ICD-10-CM | POA: Diagnosis not present

## 2017-11-14 DIAGNOSIS — F1911 Other psychoactive substance abuse, in remission: Secondary | ICD-10-CM | POA: Diagnosis not present

## 2017-11-14 NOTE — Patient Instructions (Signed)
Hepatitis C Hepatitis C is a viral infection of the liver. It can lead to scarring of the liver (cirrhosis), liver failure, or liver cancer. Hepatitis C may go undetected for months or years because people with the infection may not have symptoms, or they may have only mild symptoms. What are the causes? This condition is caused by the hepatitis C virus (HCV). The virus can spread from person to person (is contagious) through:  Blood.  Childbirth. A woman who has hepatitis C can pass it to her baby during birth.  Bodily fluids, such as breast milk, tears, semen, vaginal fluids, and saliva.  Blood transfusions or organ transplants done in the [place] before 1992.  What increases the risk? The following factors may make you more likely to develop this condition:  Having contact with unclean (contaminated) needles or syringes. This may result from: ? Acupuncture. ? Tattoing. ? Body piercing. ? Injecting drugs.  Having unprotected sex with someone who is infected.  Needing treatment to filter your blood (kidney dialysis).  Having HIV (human immunodeficiency virus) or AIDS (acquired immunodeficiency syndrome).  Working in a job that involves contact with blood or bodily fluids, such as health care.  What are the signs or symptoms? Symptoms of this condition include:  Fatigue.  Loss of appetite.  Nausea.  Vomiting.  Abdominal pain.  Dark yellow urine.  Yellowish skin and eyes (jaundice).  Itchy skin.  Clay-colored bowel movements.  Joint pain.  Bleeding and bruising easily.  Fluid building up in your stomach (ascites).  In some cases, you may not have any symptoms. How is this diagnosed? This condition is diagnosed with:  Blood tests.  Other tests to check how well your liver is functioning. They may include: ? Magnetic resonance elastography (MRE). This imaging test uses MRIs and sound waves to measure liver stiffness. ? Transient elastography. This  imaging test uses ultrasounds to measure liver stiffness. ? Liver biopsy. This test requires taking a small tissue sample from your liver to examine it under a microscope.  How is this treated? Your health care provider may perform noninvasive tests or a liver biopsy to help decide the best course of treatment. Treatment may include:  Antiviral medicines and other medicines.  Follow-up treatments every 6-12 months for infections or other liver conditions.  Receiving a donated liver (liver transplant).  Follow these instructions at home: Medicines  Take over-the-counter and prescription medicines only as told by your health care provider.  Take your antiviral medicine as told by your health care provider. Do not stop taking the antiviral even if you start to feel better.  Do not take any medicines unless approved by your health care provider, including over-the-counter medicines and birth control pills. Activity  Rest as needed.  Do not have sex unless approved by your health care provider.  Ask your health care provider when you may return to school or work. Eating and drinking  Eat a balanced diet with plenty of fruits and vegetables, whole grains, and lowfat (lean) meats or non-meat proteins (such as beans or tofu).  Drink enough fluids to keep your urine clear or pale yellow.  Do not drink alcohol. General instructions  Do not share toothbrushes, nail clippers, or razors.  Wash your hands frequently with soap and water. If soap and water are not available, use hand sanitizer.  Cover any cuts or open sores on your skin to prevent spreading the virus.  Keep all follow-up visits as told by your health care   provider. This is important. You may need follow-up visits every 6-12 months. How is this prevented? There is no vaccine for hepatitis C. The only way to prevent the disease is to reduce the risk of exposure to the virus. Make sure you:  Wash your hands frequently with  soap and water. If soap and water are not available, use hand sanitizer.  Do not share needles or syringes.  Practice safe sex and use condoms.  Avoid handling blood or bodily fluids without gloves or other protection.  Avoid getting tattoos or piercings in shops or other locations that are not clean.  Contact a health care provider if:  You have a fever.  You develop abdominal pain.  You pass dark urine.  You pass clay-colored stools.  You develop joint pain. Get help right away if:  You have increasing fatigue or weakness.  You lose your appetite.  You cannot eat or drink without vomiting.  You develop jaundice or your jaundice gets worse.  You bruise or bleed easily. Summary  Hepatitis C is a viral infection of the liver. It can lead to scarring of the liver (cirrhosis), liver failure, or liver cancer.  The hepatitis C virus (HCV) causes this condition. The virus can pass from person to person (is contagious).  You should not take any medicines unless approved by your health care provider. This includes over-the-counter medicines and birth control pills. This information is not intended to replace advice given to you by your health care provider. Make sure you discuss any questions you have with your health care provider. Document Released: 12/26/1999 Document Revised: 02/03/2016 Document Reviewed: 02/03/2016 Elsevier Interactive Patient Education  2018 [employer]  

## 2017-11-14 NOTE — Progress Notes (Signed)
Subjective:  Patient ID: [name], male    DOB: 06-09-1980  Age: 37 y.o. MRN: [id number]  CC: Establish Care   HPI HARDING THOMURE presents for establishment of care.  He recently completed a 28-day inpatient program for substance abuse at [facility].  He will start outpatient therapy tomorrow.  He has been attending meetings but does not currently have a sponsor.  Remaining clean.  He is living in his parents home.  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  He was diagnosed with hepatitis C at [facility].  However chart review shows that he had been diagnosed a few years ago with hepatitis C genotype 3.  He is currently taking no medicines.  Past medical history of depression and anxiety that was situational.  Depression or anxiety.  He does continue to smoke cigarettes.  Review of blood work obtained through [facility] shows positive for hep C with a large viral load, minimally elevated T's and mild anemia.  Outpatient Medications Prior to Visit  Medication Sig Dispense Refill  . clonazePAM (KLONOPIN) 1 MG tablet Take 1 tablet (1 mg total) by mouth 2 (two) times daily as needed for anxiety. 60 tablet 0  . sertraline (ZOLOFT) 50 MG tablet Take 1 tablet (50 mg total) by mouth daily. 30 tablet 5  . temazepam (RESTORIL) 15 MG capsule Take 1-2 capsules (15-30 mg total) by mouth at bedtime. 60 capsule 0   No facility-administered medications prior to visit.     ROS Review of Systems  Constitutional: Negative.   Respiratory: Negative.   Cardiovascular: Negative.   Gastrointestinal: Negative.   Neurological: Negative.   Hematological: Negative.   Psychiatric/Behavioral: Negative.     Objective:  BP 120/80 (BP Location: Left Arm, Patient Position: Sitting, Cuff Size: Large)   Pulse 85   Ht 6\' 4"  (1.93 m)   Wt 212 lb (96.2 kg)   SpO2 96%   BMI 25.81 kg/m   BP Readings from Last 3 Encounters:  11/14/17 120/80    12/17/15 148/87  07/26/14 140/80    Wt Readings from Last 3 Encounters:  11/14/17 212 lb (96.2 kg)  07/26/14 214 lb 9.6 oz (97.3 kg)  07/11/14 212 lb (96.2 kg)    Physical Exam  Lab Results  Component Value Date   WBC 5.2 07/26/2014   HGB 14.3 07/26/2014   HCT 41.4 07/26/2014   PLT 211 07/26/2014   GLUCOSE 88 07/26/2014   ALT 90 (H) 07/26/2014   AST 61 (H) 07/26/2014   NA 139 07/26/2014   K 3.9 07/26/2014   CL 103 07/26/2014   CREATININE 0.76 07/26/2014   BUN 9 07/26/2014   CO2 27 07/26/2014   INR 0.95 07/26/2014    Mr Abdomen Wwo Contrast  Result Date: 06/11/2016 CLINICAL DATA:  2 cm cystic lesion interpolar right kidney with possible enhancement on previous CT scan. EXAM: MRI ABDOMEN WITHOUT AND WITH CONTRAST TECHNIQUE: Multiplanar multisequence MR imaging of the abdomen was performed both before and after the administration of intravenous contrast. CONTRAST:  20mL MULTIHANCE GADOBENATE DIMEGLUMINE 529 MG/ML IV SOLN COMPARISON:  CT scan 03/02/2016. FINDINGS: Lower chest:  Unremarkable. Hepatobiliary: Liver is unremarkable. There is no evidence for gallstones, gallbladder wall thickening, or pericholecystic fluid. No intrahepatic or extrahepatic biliary dilation. Pancreas: No focal mass lesion. No dilatation of the main duct. No intraparenchymal cyst. No peripancreatic edema. Spleen: No splenomegaly. No focal mass lesion. Adrenals/Urinary Tract: No adrenal nodule  or mass. 1.5 cm well-defined homogeneous lesion identified in the lateral aspect of the interpolar left kidney corresponds to the 2 cm lesion seen on previous CT scan. By MRI, this lesion is seen to contain internal proteinaceous debris or hemorrhage, but again demonstrates no enhancement after IV contrast administration. Given the presence of this internal proteinaceous content, the lesion is now characterized as Bosniak II. The lesion in question is identified in the posterior aspect of the interpolar right kidney. This  lesion measures 1.8 cm on MRI today. It is T1 hypointense and T2 hyperintense with a single thin internal septation identified posteriorly. After IV contrast administration no enhancement is evident within the lesion. Imaging features are consistent with Bosniak II cyst. 7 mm lesion identified more anteriorly in the interpolar right kidney represents the lesion seen on previous CT. By MRI today, this lesion can be characterized as a simple (Bosniak I) cyst. Stomach/Bowel: Stomach is nondistended. No gastric wall thickening. No evidence of outlet obstruction. Duodenum is normally positioned as is the ligament of Treitz. No small bowel or colonic dilatation within the visualized abdomen. Vascular/Lymphatic: No abdominal aortic aneurysm. No evidence for abdominal lymphadenopathy. Other: No intraperitoneal free fluid. Musculoskeletal: No abnormal marrow signal within the visualized bony anatomy. IMPRESSION: 1. The bilateral nonobstructing renal stones seen on previous CT scan are not evident by MRI. 2. 1.5 cm well-defined lesion in the interpolar left kidney corresponds to the 2 cm lesion identified on previous CT. MR imaging features today are consistent with Bosniak II cyst. 3. 1.8 cm lesion in the posterior aspect of the interpolar right kidney corresponds to the lesion in question on previous CT scan. Single thin internal septation identified within this cyst consistent with Bosniak II category. 4. 7 mm Bosniak I cyst anterior aspect interpolar right kidney. Electronically Signed   By: Kennith Center M.D.   On: 06/11/2016 20:09    Assessment & Plan:   Reinhold was seen today for establish care.  Diagnoses and all orders for this visit:  Hepatitis C virus infection without hepatic coma, unspecified chronicity -     Amb Referral to Hepatology  History of substance abuse (HCC)   I have discontinued Jasen E. Galyon's sertraline, temazepam, and clonazePAM.  No orders of the defined types were placed in this  encounter.  Encourage patient to participate in outpatient treatment, attending meetings and find a sponsor.  Will refer to otology clinic for consideration of treatment for hepatitis C.  He will follow-up in 3 months or as needed.  Follow-up: Return in about 3 months (around 02/14/2018).  Mliss Sax, MD

## 2017-11-15 ENCOUNTER — Ambulatory Visit (HOSPITAL_COMMUNITY): Payer: BLUE CROSS/BLUE SHIELD | Admitting: Psychology

## 2017-11-17 ENCOUNTER — Ambulatory Visit (HOSPITAL_COMMUNITY): Payer: BLUE CROSS/BLUE SHIELD | Admitting: Psychology

## 2017-11-20 ENCOUNTER — Telehealth (HOSPITAL_COMMUNITY): Payer: Self-pay | Admitting: Psychology

## 2017-11-22 ENCOUNTER — Telehealth (HOSPITAL_COMMUNITY): Payer: Self-pay | Admitting: Psychology

## 2017-11-27 NOTE — Progress Notes (Signed)
Patient Name: Samuel Andrade  Date of Birth: 1980/07/20  MRN: [id number]  PCP: Mliss SaxKremer, William Alfred, MD  Referring Provider: Mliss SaxKremer, William Alfred, MD   HPI/ROS:  Samuel Andrade is a 37 y.o. male who presents for initial evaluation and management of chronic hepatitis C. Jibri had a positive Hep C Ab test back in 2016 with Genotype 3 and VL 7,829,5623,408,766 copies. He tells me he was also [facility] as he was undergoing drug rehab treatment for heroin use. He was hospitalized for overdose in June of this year related to OUD. He is ready to accept treatment for Hep C and is not actively using (last used September). He has never had treatment for his hepatitis c infection. Hepatitis C-associated risk factors present include injection drug use (5 years duration).   He has not had any recent studies done to stage liver disease. Had an abdominal ultrasound in 2017 that was normal limits with regards to liver texture and w/o mass effect. He is not on opioid replacement therapy presently and has not been attending any NA meetings per chart review. Lives with his parents now. He does not offer much further details.   Patient does not have documented immunity to Hepatitis A. Patient does not have documented immunity to Hepatitis B.    Constitutional: negative for fevers, fatigue and malaise Eyes: negative for icterus Cardiovascular: negative for chest pain, orthopnea, lower extremity edema Gastrointestinal: negative for change in bowel habits, abdominal pain and jaundice Integument/breast: negative for rash and pruritus Hematologic/lymphatic: negative for easy bruising and petechiae Musculoskeletal: negative for arthralgias Neurological: negative for coordination problems and gait problems All other systems reviewed and are negative      Past Medical History:  Diagnosis Date  . Anxiety   . Depression   . Neuromuscular disorder (HCC)     Prior to Admission medications     Not on File    No Known Allergies  Social History   Tobacco Use  . Smoking status: Current Every Day Smoker    Packs/day: 0.50    Years: 9.00    Pack years: 4.50    Types: Cigarettes  . Smokeless tobacco: Never Used  Substance Use Topics  . Alcohol use: Yes    Comment: once a week  . Drug use: Yes    Types: IV    Comment: heroin, Xanax    Family History  Problem Relation Age of Onset  . Hypertension Father   . Healthy Mother   . Stroke Maternal Grandmother   . Mental illness Maternal Grandmother   . Stroke Maternal Grandfather   . Heart disease Maternal Grandfather   . Stroke Paternal Grandmother   . Diabetes Paternal Grandfather     Objective:   Vitals:   11/28/17 1413  BP: 137/88  Pulse: 83  Temp: 98.6 F (37 C)   Constitutional: in no apparent distress, oriented times 3 and anicteric. Disheveled/unkempt appearance  Eyes: anicteric Cardiovascular: Cor RRR and No murmurs Respiratory: clear Gastrointestinal: Bowel sounds are normal, liver is not enlarged, spleen is not enlarged Musculoskeletal: peripheral pulses normal, no pedal edema, no clubbing or cyanosis Skin: negative for - jaundice, spider hemangioma, telangiectasia, palmar erythema, ecchymosis and atrophy; no porphyria cutanea tarda Lymphatic: no cervical lymphadenopathy   Laboratory: Genotype:  Lab Results  Component Value Date   HCVGENOTYPE 3 07/26/2014   HCV viral load:  Lab Results  Component Value Date   HCVQUANT 1,308,6576,076,591 (H) 07/26/2014   Lab  Results  Component Value Date   WBC 5.4 11/28/2017   HGB 13.7 11/28/2017   HCT 40.0 11/28/2017   MCV 87.0 11/28/2017   PLT 252 11/28/2017    Lab Results  Component Value Date   CREATININE 0.76 07/26/2014   BUN 9 07/26/2014   NA 139 07/26/2014   K 3.9 07/26/2014   CL 103 07/26/2014   CO2 27 07/26/2014    Lab Results  Component Value Date   ALT 90 (H) 07/26/2014   AST 61 (H) 07/26/2014   ALKPHOS 80 07/26/2014    Lab Results   Component Value Date   INR 0.95 07/26/2014   BILITOT 0.5 07/26/2014   ALBUMIN 3.7 07/26/2014   Imaging:   Assessment & Plan:   Problem List Items Addressed This Visit      Unprioritized   Hepatitis C virus infection without hepatic coma    New Patient with Chronic Hepatitis C genotype 3, treatment naive.   I discussed with the patient the lab findings that confirm chronic hepatitis C as well as the natural history and progression of disease including about 30% of people who develop cirrhosis of the liver if left untreated and once cirrhosis is established there is a 2-7% risk per year of liver cancer and liver failure. I do not have labs from [facility] and will need to obtain updated labs today. I discussed the importance of treatment and benefits in reducing the risk, even if significant liver fibrosis exists. I also discussed risk for re-infection following treatment should he not continue to modify risk factors.    Patient counseled extensively on limiting acetaminophen to no more than 2 grams daily, avoidance of alcohol.  Transmission discussed with patient including sexual transmission, sharing razors and toothbrush.   Will need referral to gastroenterology if concern for cirrhosis  He is supposed to be working with [facility] outpatient rehab services but not making appts. I worry very much about durable sobriety for Ramzi and will continue to support opioid replacement therapy as well as sessions with professional team. He does not seem to be very motivated presently.   Will prescribe appropriate medication based on genotype and coverage   Hepatitis A and B titers to be drawn today with appropriate vaccinations as needed   Pneumovax vaccine at upcoming visit if not previously given --> declined today.   Further work up to include liver staging through non-invasive blood work and U/S if needed.   Will call him once labs are back to determine if he needs baseline ultrasound or if we can  proceed with treatment. Epclusa x 12 weeks or Mavyret x 8 weeks pending he has no severe fibrosis/cirrhosis.        Relevant Orders   Liver Fibrosis, FibroTest-ActiTest   Hepatitis C genotype   Hepatitis C RNA quantitative   Hepatitis B surface antigen   Hepatitis B Core Antibody, total   Hepatitis B surface antibody,qualitative   Hepatitis A Ab, Total   Comprehensive metabolic panel   CBC (Completed)   Protime-INR   HIV Antibody (routine testing w rflx)   History of substance abuse (HCC)    History of heroin use x 5 years with h/o overdose. He does has not had any other infections related to use. He completed inpatient rehab program through [facility] but I see in the chart that he has not been attending NA as instructed and no longer qualifies for intensive outpatient treatment program. Discussed that there is > 50% relapse rate and  would be best for him to continue outpatient work on this for durable sobriety.       Tobacco abuse    Ongoing. Pre-contemplative to quit.         I spent 30 minutes with the patient including greater than 70% of time in face to face counsel of the patient re hepatitis c and the details described above and in coordination of their care.  Rexene Alberts, MSN, NP-C [facility]  [email or web address] Pager: [phone] Office: [phone]

## 2017-11-27 NOTE — Assessment & Plan Note (Addendum)
New Patient with Chronic Hepatitis C genotype 3, treatment naive.   I discussed with the patient the lab findings that confirm chronic hepatitis C as well as the natural history and progression of disease including about 30% of people who develop cirrhosis of the liver if left untreated and once cirrhosis is established there is a 2-7% risk per year of liver cancer and liver failure. I do not have labs from [facility] and will need to obtain updated labs today. I discussed the importance of treatment and benefits in reducing the risk, even if significant liver fibrosis exists. I also discussed risk for re-infection following treatment should he not continue to modify risk factors.    Patient counseled extensively on limiting acetaminophen to no more than 2 grams daily, avoidance of alcohol.  Transmission discussed with patient including sexual transmission, sharing razors and toothbrush.   Will need referral to gastroenterology if concern for cirrhosis  He is supposed to be working with [facility] outpatient rehab services but not making appts. I worry very much about durable sobriety for Samuel Andrade and will continue to support opioid replacement therapy as well as sessions with professional team. He does not seem to be very motivated presently.   Will prescribe appropriate medication based on genotype and coverage   Hepatitis A and B titers to be drawn today with appropriate vaccinations as needed   Pneumovax vaccine at upcoming visit if not previously given --> declined today.   Further work up to include liver staging through non-invasive blood work and U/S if needed.   Will call him once labs are back to determine if he needs baseline ultrasound or if we can proceed with treatment. Epclusa x 12 weeks or Mavyret x 8 weeks pending he has no severe fibrosis/cirrhosis.

## 2017-11-28 ENCOUNTER — Encounter: Payer: Self-pay | Admitting: Infectious Diseases

## 2017-11-28 ENCOUNTER — Ambulatory Visit (INDEPENDENT_AMBULATORY_CARE_PROVIDER_SITE_OTHER): Payer: BLUE CROSS/BLUE SHIELD | Admitting: Infectious Diseases

## 2017-11-28 VITALS — BP 137/88 | HR 83 | Temp 98.6°F | Wt 214.0 lb

## 2017-11-28 DIAGNOSIS — B1920 Unspecified viral hepatitis C without hepatic coma: Secondary | ICD-10-CM

## 2017-11-28 DIAGNOSIS — Z720 Tobacco use: Secondary | ICD-10-CM

## 2017-11-28 DIAGNOSIS — F1911 Other psychoactive substance abuse, in remission: Secondary | ICD-10-CM

## 2017-11-28 NOTE — Assessment & Plan Note (Signed)
Ongoing. Pre-contemplative to quit.

## 2017-11-28 NOTE — Assessment & Plan Note (Signed)
History of heroin use x 5 years with h/o overdose. He does has not had any other infections related to use. He completed inpatient rehab program through [facility] but I see in the chart that he has not been attending NA as instructed and no longer qualifies for intensive outpatient treatment program. Discussed that there is > 50% relapse rate and would be best for him to continue outpatient work on this for durable sobriety.

## 2017-11-28 NOTE — Patient Instructions (Signed)
Nice to meet you today!    We need to get a little more information about your hepatitis c infection before we start your treatment. I anticipate that we can get you started in a few weeks.    Until we can get you treated would recommend: - condoms with sexual encounters or abstinence - no sharing of razors, toothbrushes or anything that could potentially have blood on it.  - limit alcohol to as little as possible to less than 1 drink a day  - limit tylenol use to less than 2,000 mg daily    We will call you with the results of your blood work and next steps about your medication and insurance process.   Planning on starting you on a one a day medication called Mavyret. If your liver damage is not severe we could complete treatment in as little as 8 weeks for you.

## 2017-11-29 NOTE — Progress Notes (Signed)
APRI score 0.39 FIB-4 0.76 >> both non-invasive scores indicate Metavir F0 and statistically low risk for fibrosis. Will proceed with either Mavyret x 8w or Epclusa x 12 weeks pending insurance preferred med.

## 2017-11-30 ENCOUNTER — Other Ambulatory Visit: Payer: Self-pay | Admitting: Pharmacist

## 2017-11-30 DIAGNOSIS — B1920 Unspecified viral hepatitis C without hepatic coma: Secondary | ICD-10-CM

## 2017-11-30 MED ORDER — GLECAPREVIR-PIBRENTASVIR 100-40 MG PO TABS: 3.0000 | ORAL_TABLET | Freq: Every day | ORAL | 1 refills | Status: DC

## 2017-11-30 NOTE — Progress Notes (Signed)
Patient with genotype 3 and low risk of fibrosis on APRI and FIB 4 test. Will send in Mavyret x 8 weeks and Kathie RhodesBetty will start working on [employer] insurance process.

## 2017-12-02 LAB — LIVER FIBROSIS, FIBROTEST-ACTITEST
ALPHA-2-MACROGLOBULIN: 166 mg/dL (ref 106–279)
ALT: 43 U/L (ref 9–46)
APOLIPOPROTEIN A1: 147 mg/dL (ref 94–176)
Bilirubin: 0.4 mg/dL (ref 0.2–1.2)
Fibrosis Score: 0.06
GGT: 11 U/L (ref 3–90)
Haptoglobin: 181 mg/dL (ref 43–212)
Necroinflammat ACT Score: 0.18
REFERENCE ID: 2733535

## 2017-12-02 LAB — COMPREHENSIVE METABOLIC PANEL
AG RATIO: 1.5 (calc) (ref 1.0–2.5)
ALBUMIN MSPROF: 3.9 g/dL (ref 3.6–5.1)
ALKALINE PHOSPHATASE (APISO): 78 U/L (ref 40–115)
ALT: 43 U/L (ref 9–46)
AST: 34 U/L (ref 10–40)
BUN/Creatinine Ratio: 7 (calc) (ref 6–22)
BUN: 6 mg/dL — ABNORMAL LOW (ref 7–25)
CHLORIDE: 103 mmol/L (ref 98–110)
CO2: 30 mmol/L (ref 20–32)
CREATININE: 0.81 mg/dL (ref 0.60–1.35)
Calcium: 9.1 mg/dL (ref 8.6–10.3)
GLOBULIN: 2.6 g/dL (ref 1.9–3.7)
Glucose, Bld: 94 mg/dL (ref 65–99)
POTASSIUM: 4.5 mmol/L (ref 3.5–5.3)
Sodium: 139 mmol/L (ref 135–146)
Total Bilirubin: 0.5 mg/dL (ref 0.2–1.2)
Total Protein: 6.5 g/dL (ref 6.1–8.1)

## 2017-12-02 LAB — HEPATITIS B SURFACE ANTIBODY,QUALITATIVE: HEP B S AB: REACTIVE — AB

## 2017-12-02 LAB — PROTIME-INR
INR: 0.9
PROTHROMBIN TIME: 9.7 s (ref 9.0–11.5)

## 2017-12-02 LAB — CBC
HEMATOCRIT: 40 % (ref 38.5–50.0)
Hemoglobin: 13.7 g/dL (ref 13.2–17.1)
MCH: 29.8 pg (ref 27.0–33.0)
MCHC: 34.3 g/dL (ref 32.0–36.0)
MCV: 87 fL (ref 80.0–100.0)
MPV: 9.9 fL (ref 7.5–12.5)
Platelets: 252 10*3/uL (ref 140–400)
RBC: 4.6 10*6/uL (ref 4.20–5.80)
RDW: 12.3 % (ref 11.0–15.0)
WBC: 5.4 10*3/uL (ref 3.8–10.8)

## 2017-12-02 LAB — HEPATITIS B SURFACE ANTIGEN: HEP B S AG: NONREACTIVE

## 2017-12-02 LAB — HEPATITIS C RNA QUANTITATIVE
HCV QUANT LOG: 6.84 {Log_IU}/mL — AB
HCV RNA, PCR, QN: 6970000 [IU]/mL — AB

## 2017-12-02 LAB — HEPATITIS B CORE ANTIBODY, TOTAL: HEP B C TOTAL AB: REACTIVE — AB

## 2017-12-02 LAB — HEPATITIS A ANTIBODY, TOTAL: Hepatitis A AB,Total: NONREACTIVE

## 2017-12-02 LAB — HIV ANTIBODY (ROUTINE TESTING W REFLEX): HIV 1&2 Ab, 4th Generation: NONREACTIVE

## 2017-12-02 LAB — HEPATITIS C GENOTYPE: HCV GENOTYPE: 3

## 2017-12-13 ENCOUNTER — Telehealth: Payer: Self-pay | Admitting: Pharmacist

## 2017-12-13 NOTE — Telephone Encounter (Signed)
Highly suspicious that this is re-infection considering he relapsed for a significant time frame with IV drug use again after his successful treatment. Would like to go forward with treatment with Mavyret as planned x 8w.  He has since completed inpatient rehab program however is not in an outpatient intensive setting per his choice. We discussed that there is a high relapse rate and would like for him to resume outpatient services for care.  Thank you

## 2017-12-13 NOTE — Telephone Encounter (Signed)
Patient recently saw Judeth CornfieldStephanie for his Hepatitis C infection.  He has genotype 3 and a Hep C viral load of 6.9 million.  Mavyret x 8 weeks was sent in to his [employer] insurance but they kept waiting to approve. When Deep RiverBetty called the insurance, they asked if he had been treated before, which to our knowledge, he had not. After some digging, we found a note from a Hepatology clinic from back in 2017 which stated he was treated with 12 weeks of Epclusa and achieved a SVR12 cure. This seems to be a re-infection as he has had some ongoing IV drug use since 2017. Kathie RhodesBetty discussed this with his insurance and we faxed a [facility] of the [facility] from [facility]. We will continue pursuing Mavyret x 8 weeks. Will keep Judeth CornfieldStephanie updated on process/approval.

## 2017-12-21 ENCOUNTER — Encounter: Payer: Self-pay | Admitting: Pharmacy Technician

## 2017-12-22 ENCOUNTER — Telehealth: Payer: Self-pay | Admitting: Pharmacist

## 2017-12-22 MED FILL — MAVYRET 100-40 MG TABS: 100-40 | 28 days supply | Qty: 84 | Fill #0

## 2017-12-22 NOTE — Telephone Encounter (Signed)
Patient is approved to receive Mavyret x 8 weeks for chronic Hepatitis C infection. Counseled patient to take all three tablets of Mavyret daily with food.  Counseled patient the need to take all three tablets together and to not separate them out during the day. Encouraged patient not to miss any doses and explained how their chance of cure could go down with each dose missed. Counseled patient on what to do if dose is missed - if it is closer to the missed dose take immediately; if closer to next dose then skip dose and take the next dose at the usual time.   Counseled patient on common symptoms including headache, fatigue, and nausea and that the symptoms normally decrease with time. I reviewed patient medications and found no drug interactions. Discussed with patient that there are several drug interactions with Mavyret and instructed patient to call the clinic if he wishes to start a new medication during course of therapy. Also advised patient to call if he experiences any side effects. Follow up with me on 1/14 at 4pm.

## 2017-12-22 NOTE — Telephone Encounter (Signed)
Thank you :)

## 2018-01-19 MED FILL — MAVYRET 100-40 MG TABS: 100-40 | 28 days supply | Qty: 84 | Fill #1

## 2018-01-24 ENCOUNTER — Ambulatory Visit (INDEPENDENT_AMBULATORY_CARE_PROVIDER_SITE_OTHER): Payer: BLUE CROSS/BLUE SHIELD | Admitting: Pharmacist

## 2018-01-24 DIAGNOSIS — B1920 Unspecified viral hepatitis C without hepatic coma: Secondary | ICD-10-CM

## 2018-01-24 NOTE — Progress Notes (Signed)
HPI: Samuel Andrade is a 38 y.o. male who presents to the [facility] pharmacy clinic for one month follow-up visit on Mavyret for Hepatitis C re-treatment.  Medication: Mavyret  Start Date: 12/23/2017  Hepatitis C Genotype: 3  Fibrosis Score: F0  Hepatitis C RNA: 6 million on 07/26/2014, 6.97 million on 11/28/2017  Patient Active Problem List   Diagnosis Date Noted  . History of substance abuse (HCC) 11/14/2017  . Hepatitis C virus infection without hepatic coma 08/19/2014  . Tobacco abuse 08/19/2014  . Splenomegaly 08/19/2014    Patient's Medications  New Prescriptions   No medications on file  Previous Medications   GLECAPREVIR-PIBRENTASVIR (MAVYRET) 100-40 MG TABS    Take 3 tablets by mouth daily with breakfast.  Modified Medications   No medications on file  Discontinued Medications   No medications on file    Allergies: No Known Allergies  Past Medical History: Past Medical History:  Diagnosis Date  . Anxiety   . Depression   . Neuromuscular disorder [facility](HCC)     Social History: Social History   Socioeconomic History  . Marital status: Single    Spouse name: Not on file  . Number of children: Not on file  . Years of education: Not on file  . Highest education level: Not on file  Occupational History  . Not on file  Social Needs  . Financial resource strain: Not on file  . Food insecurity:    Worry: Not on file    Inability: Not on file  . Transportation needs:    Medical: Not on file    Non-medical: Not on file  Tobacco Use  . Smoking status: Current Every Day Smoker    Packs/day: 0.50    Years: 9.00    Pack years: 4.50    Types: Cigarettes  . Smokeless tobacco: Never Used  Substance and Sexual Activity  . Alcohol use: Yes    Comment: once a week  . Drug use: Yes    Types: IV    Comment: heroin, Xanax  . Sexual activity: Not Currently  Lifestyle  . Physical activity:    Days per week: Not on file    Minutes per session: Not on file  .  Stress: Not on file  Relationships  . Social connections:    Talks on phone: Not on file    Gets together: Not on file    Attends religious service: Not on file    Active member of club or organization: Not on file    Attends meetings of clubs or organizations: Not on file    Relationship status: Not on file  Other Topics Concern  . Not on file  Social History Narrative  . Not on file    Labs: Hepatitis C Lab Results  Component Value Date   HCVGENOTYPE 3 11/28/2017   HCVRNAPCRQN 6,970,000 (H) 11/28/2017   FIBROSTAGE F0 11/28/2017   Hepatitis B Lab Results  Component Value Date   HEPBSAB REACTIVE (A) 11/28/2017   HEPBSAG NON-REACTIVE 11/28/2017   HEPBCAB REACTIVE (A) 11/28/2017   Hepatitis A Lab Results  Component Value Date   HAV NON-REACTIVE 11/28/2017   HIV Lab Results  Component Value Date   HIV NON-REACTIVE 11/28/2017   HIV NONREACTIVE 07/26/2014   Lab Results  Component Value Date   CREATININE 0.81 11/28/2017   CREATININE 0.76 07/26/2014   Lab Results  Component Value Date   AST 34 11/28/2017   AST 61 (H) 07/26/2014   ALT 43  11/28/2017   ALT 43 11/28/2017   ALT 90 (H) 07/26/2014   INR 0.9 11/28/2017   INR 0.95 07/26/2014    Assessment: Samuel RuizJohn presents to clinic today for a one-month follow-up on Mavyret. He began his 8 weeks of therapy on 12/23/2017. He is tolerating the medication well and reports no side effects or missed doses. He takes all three pills every night with food. He also states that he received his refill in the mail today for his second month of medication. He does not report any other questions or concerns with the medications. He does not have any documented immunity to Hepatitis A but refused HepA and influenza vaccines today. Encouraged patient to let Samuel Andrade know if he changes his mind about these vaccinations.  Will order HCV RNA today as well as CMET. Will schedule him with Samuel Andrade for his EOT appointment on 3/2 at 3:45.    Plan: -HCV RNA, CMET -Follow-up with Samuel Andrade on 3/2 at 3:45   Samuel Andrade [facility] Pharm[facility] Student [facility] 01/24/2018, 4:28 PM

## 2018-01-26 LAB — COMPREHENSIVE METABOLIC PANEL
AG Ratio: 1.3 (calc) (ref 1.0–2.5)
ALBUMIN MSPROF: 3.9 g/dL (ref 3.6–5.1)
ALT: 9 U/L (ref 9–46)
AST: 14 U/L (ref 10–40)
Alkaline phosphatase (APISO): 97 U/L (ref 40–115)
BUN: 8 mg/dL (ref 7–25)
CHLORIDE: 102 mmol/L (ref 98–110)
CO2: 28 mmol/L (ref 20–32)
Calcium: 9.4 mg/dL (ref 8.6–10.3)
Creat: 0.84 mg/dL (ref 0.60–1.35)
GLUCOSE: 81 mg/dL (ref 65–99)
Globulin: 2.9 g/dL (calc) (ref 1.9–3.7)
Potassium: 4.7 mmol/L (ref 3.5–5.3)
SODIUM: 138 mmol/L (ref 135–146)
TOTAL PROTEIN: 6.8 g/dL (ref 6.1–8.1)
Total Bilirubin: 0.5 mg/dL (ref 0.2–1.2)

## 2018-01-26 LAB — HEPATITIS C RNA QUANTITATIVE
HCV Quantitative Log: <1.18 Log IU/mL — AB
HCV RNA, PCR, QN: <15 IU/mL — AB

## 2018-03-13 ENCOUNTER — Other Ambulatory Visit: Payer: Self-pay

## 2018-03-13 ENCOUNTER — Encounter: Payer: Self-pay | Admitting: Infectious Diseases

## 2018-03-13 ENCOUNTER — Ambulatory Visit (INDEPENDENT_AMBULATORY_CARE_PROVIDER_SITE_OTHER): Payer: BLUE CROSS/BLUE SHIELD | Admitting: Infectious Diseases

## 2018-03-13 VITALS — BP 117/74 | HR 64 | Temp 98.0°F | Ht 75.0 in | Wt 204.0 lb

## 2018-03-13 DIAGNOSIS — F1911 Other psychoactive substance abuse, in remission: Secondary | ICD-10-CM | POA: Diagnosis not present

## 2018-03-13 DIAGNOSIS — B1920 Unspecified viral hepatitis C without hepatic coma: Secondary | ICD-10-CM | POA: Diagnosis not present

## 2018-03-13 NOTE — Patient Instructions (Addendum)
Please finish out your Mavyret as you are.   Will call you with your lab results today to let you know if your treatment is still working.   Will have you back in 3 months with repeat labs 1 week before your visit please if you can.

## 2018-03-13 NOTE — Assessment & Plan Note (Signed)
I am a little worried he had a lapse in his treatment up to a week. His timeline however suggests that this may have actually been a delay in sending his initial prescription putting him nearly on target based on our records. Will have him complete his course and remaining 5 days. Will check RNA today and have him return again in 64m for SVR visit.   He has no evidence of long standing liver damage/severe fibrosis on his labs fortunately.

## 2018-03-13 NOTE — Progress Notes (Signed)
Patient Name: Samuel Andrade  Date of Birth: [birth year]  MRN: [id number]  PCP: Mliss Sax, MD  Referring Provider: Mliss Sax, MD   HPI/ROS:  Samuel Andrade is a 38 y.o. male being treated for chronic hepatitis C infection, genotype 3, F0, previously treated in the past (re-infection).   He tells me he has about 5 days left of his Mavyret. He had what sounds like a potential delay in starting his medications but I worry it is actually a lapse. It is a little unclear with his history after multiple attempts to ask. He reports no side effects to the Mavyret and says he was delayed getting the medicine form pharmacy only that one time. He is currently still attending NA meetings and finds these helpful to stay sober. He has never been free from drugs this amount of time before and happy with his progress. Currently working a Corporate investment banker.   He declined all vaccines.   Constitutional: negative for fevers, fatigue and malaise Eyes: negative for icterus Cardiovascular: negative for chest pain, orthopnea, lower extremity edema Gastrointestinal: negative for change in bowel habits, abdominal pain and jaundice Integument/breast: negative for rash and pruritus Hematologic/lymphatic: negative for easy bruising and petechiae Musculoskeletal: negative for arthralgias Neurological: negative for coordination problems and gait problems All other systems reviewed and are negative      Past Medical History:  Diagnosis Date  . Anxiety   . Depression   . Neuromuscular disorder [facility])    Outpatient Medications Prior to Visit  Medication Sig Dispense Refill  . Glecaprevir-Pibrentasvir (MAVYRET) 100-40 MG TABS Take 3 tablets by mouth daily with breakfast. 84 tablet 1   No facility-administered medications prior to visit.     No Known Allergies  Social History   Tobacco Use  . Smoking status: Current Every Day Smoker    Packs/day: 0.50    Years: 9.00    Pack years:  4.50    Types: Cigarettes  . Smokeless tobacco: Never Used  Substance Use Topics  . Alcohol use: Yes    Comment: once a week  . Drug use: Yes    Types: IV    Comment: heroin, Xanax     Objective:   Vitals:   03/13/18 1545  BP: 117/74  Pulse: 64  Temp: 98 F (36.7 C)   Constitutional: in no apparent distress, oriented times 3 and anicteric. Disheveled/unkempt appearance  Eyes: anicteric Cardiovascular: Cor RRR and No murmurs Respiratory: clear Gastrointestinal: Bowel sounds are normal, liver is not enlarged, spleen is not enlarged Musculoskeletal: peripheral pulses normal, no pedal edema, no clubbing or cyanosis Skin: negative for - jaundice, spider hemangioma, telangiectasia, palmar erythema, ecchymosis and atrophy; no porphyria cutanea tarda Lymphatic: no cervical lymphadenopathy   Laboratory: Genotype:  Lab Results  Component Value Date   HCVGENOTYPE 3 11/28/2017   HCV viral load:  Lab Results  Component Value Date   HCVQUANT 1,694,503 (H) 07/26/2014   Lab Results  Component Value Date   WBC 5.4 11/28/2017   HGB 13.7 11/28/2017   HCT 40.0 11/28/2017   MCV 87.0 11/28/2017   PLT 252 11/28/2017    Lab Results  Component Value Date   CREATININE 0.84 01/24/2018   BUN 8 01/24/2018   NA 138 01/24/2018   K 4.7 01/24/2018   CL 102 01/24/2018   CO2 28 01/24/2018    Lab Results  Component Value Date   ALT 9 01/24/2018   AST 14 01/24/2018   ALKPHOS 80 07/26/2014  Lab Results  Component Value Date   INR 0.9 11/28/2017   BILITOT 0.5 01/24/2018   ALBUMIN 3.7 07/26/2014   Imaging:   Assessment & Plan:   Problem List Items Addressed This Visit      Unprioritized   Hepatitis C virus infection without hepatic coma - Primary    I am a little worried he had a lapse in his treatment up to a week. His timeline however suggests that this may have actually been a delay in sending his initial prescription putting him nearly on target based on our records. Will  have him complete his course and remaining 5 days. Will check RNA today and have him return again in 27m for SVR visit.   He has no evidence of long standing liver damage/severe fibrosis on his labs fortunately.       Relevant Orders   Hepatitis C RNA quantitative   Hepatic function panel   Hepatitis C RNA quantitative   History of substance abuse [facility])    Doing well attending NA meetings per his report. I congratulated him on his work and hope that he continues to have good results with this.          Rexene Alberts, MSN, NP-C [facility]  [email or web address] Pager: [phone] Office: [phone]

## 2018-03-13 NOTE — Assessment & Plan Note (Signed)
Doing well attending NA meetings per his report. I congratulated him on his work and hope that he continues to have good results with this.

## 2018-03-15 ENCOUNTER — Telehealth: Payer: Self-pay

## 2018-03-15 LAB — HEPATIC FUNCTION PANEL
AG Ratio: 1.5 (calc) (ref 1.0–2.5)
ALBUMIN MSPROF: 3.9 g/dL (ref 3.6–5.1)
ALT: 12 U/L (ref 9–46)
AST: 21 U/L (ref 10–40)
Alkaline phosphatase (APISO): 102 U/L (ref 36–130)
BILIRUBIN INDIRECT: 0.3 mg/dL (ref 0.2–1.2)
BILIRUBIN TOTAL: 0.4 mg/dL (ref 0.2–1.2)
Bilirubin, Direct: 0.1 mg/dL (ref 0.0–0.2)
GLOBULIN: 2.6 g/dL (ref 1.9–3.7)
Total Protein: 6.5 g/dL (ref 6.1–8.1)

## 2018-03-15 LAB — HEPATITIS C RNA QUANTITATIVE
HCV Quantitative Log: <1.18 Log IU/mL
HCV RNA, PCR, QN: <15 IU/mL

## 2018-03-15 NOTE — Telephone Encounter (Signed)
Called Samuel Andrade per Rexene Alberts NP. To discuss lab results.  Left message with Samuel Andrade to call office.    Gerarda Fraction, [place]

## 2018-03-15 NOTE — Telephone Encounter (Signed)
-----   Message from Blanchard Kelch, NP sent at 03/15/2018  1:31 PM EST ----- Please call Mr. Camire to let him know that his hepatitis c blood test shows that his medication is working well. We need a return visit in 3 months please if you could help him schedule an appointment with me the second week of June and a lab visit 1 week before.  Thank you!

## 2018-03-15 NOTE — Progress Notes (Signed)
Please call Samuel Andrade to let him know that his hepatitis c blood test shows that his medication is working well. We need a return visit in 3 months please if you could help him schedule an appointment with me the second week of June and a lab visit 1 week before.  Thank you!

## 2018-04-04 NOTE — Telephone Encounter (Signed)
Attempted to call patient to review lab results/schedule lab appointment for last week in June. Unable to reach patient at this time. Left voicemail requesting patient to call office back. Lorenso Courier, [place]

## 2018-04-05 NOTE — Telephone Encounter (Signed)
Left Jeromiah a message to give [place] a call back. Will attempt to call at a later time.   Gerarda Fraction, [place]

## 2018-05-11 ENCOUNTER — Encounter: Payer: BLUE CROSS/BLUE SHIELD | Admitting: Family Medicine

## 2018-05-11 ENCOUNTER — Encounter: Payer: Self-pay | Admitting: Family Medicine

## 2018-05-11 NOTE — Progress Notes (Deleted)
Virtual Visit via Video Note  I connected with [name] on 05/11/18 at  2:30 PM EDT by a video enabled telemedicine application and verified that I am speaking with the correct person using two identifiers.  Location: Patient: *** Provider: ***   I discussed the limitations of evaluation and management by telemedicine and the availability of in person appointments. The patient expressed understanding and agreed to proceed.  History of Present Illness:    Observations/Objective:   Assessment and Plan:   Follow Up Instructions:    I discussed the assessment and treatment plan with the patient. The patient was provided an opportunity to ask questions and all were answered. The patient agreed with the plan and demonstrated an understanding of the instructions.   The patient was advised to call back or seek an in-person evaluation if the symptoms worsen or if the condition fails to improve as anticipated.  I provided *** minutes of non-face-to-face time during this encounter.

## 2018-05-16 ENCOUNTER — Ambulatory Visit: Payer: BLUE CROSS/BLUE SHIELD | Admitting: Family Medicine

## 2018-05-19 ENCOUNTER — Encounter: Payer: Self-pay | Admitting: Family Medicine

## 2018-05-19 ENCOUNTER — Encounter (HOSPITAL_BASED_OUTPATIENT_CLINIC_OR_DEPARTMENT_OTHER): Payer: Self-pay | Admitting: Emergency Medicine

## 2018-05-19 ENCOUNTER — Ambulatory Visit (INDEPENDENT_AMBULATORY_CARE_PROVIDER_SITE_OTHER): Payer: BLUE CROSS/BLUE SHIELD | Admitting: Family Medicine

## 2018-05-19 ENCOUNTER — Emergency Department (HOSPITAL_BASED_OUTPATIENT_CLINIC_OR_DEPARTMENT_OTHER)
Admission: EM | Admit: 2018-05-19 | Discharge: 2018-05-20 | Disposition: A | Discharge status: Home / Self Care | Payer: BLUE CROSS/BLUE SHIELD | Attending: Emergency Medicine | Admitting: Emergency Medicine

## 2018-05-19 ENCOUNTER — Other Ambulatory Visit: Payer: Self-pay

## 2018-05-19 VITALS — Ht 75.0 in

## 2018-05-19 DIAGNOSIS — F1911 Other psychoactive substance abuse, in remission: Secondary | ICD-10-CM | POA: Diagnosis not present

## 2018-05-19 DIAGNOSIS — M545 Low back pain, unspecified: Secondary | ICD-10-CM

## 2018-05-19 DIAGNOSIS — M5124 Other intervertebral disc displacement, thoracic region: Secondary | ICD-10-CM | POA: Diagnosis not present

## 2018-05-19 DIAGNOSIS — F1910 Other psychoactive substance abuse, uncomplicated: Secondary | ICD-10-CM | POA: Diagnosis not present

## 2018-05-19 DIAGNOSIS — M5127 Other intervertebral disc displacement, lumbosacral region: Secondary | ICD-10-CM | POA: Diagnosis not present

## 2018-05-19 DIAGNOSIS — F1110 Opioid abuse, uncomplicated: Secondary | ICD-10-CM | POA: Diagnosis not present

## 2018-05-19 DIAGNOSIS — F17210 Nicotine dependence, cigarettes, uncomplicated: Secondary | ICD-10-CM | POA: Insufficient documentation

## 2018-05-19 DIAGNOSIS — M47814 Spondylosis without myelopathy or radiculopathy, thoracic region: Secondary | ICD-10-CM | POA: Diagnosis not present

## 2018-05-19 DIAGNOSIS — M5441 Lumbago with sciatica, right side: Secondary | ICD-10-CM | POA: Diagnosis not present

## 2018-05-19 DIAGNOSIS — M5126 Other intervertebral disc displacement, lumbar region: Secondary | ICD-10-CM | POA: Diagnosis not present

## 2018-05-19 HISTORY — DX: Opioid abuse, uncomplicated: F11.10

## 2018-05-19 HISTORY — DX: Poisoning by other opioids, accidental (unintentional), initial encounter: T40.2X1A

## 2018-05-19 HISTORY — DX: Other psychoactive substance abuse, uncomplicated: F19.10

## 2018-05-19 LAB — CBC WITH DIFFERENTIAL/PLATELET
Abs Immature Granulocytes: 0.01 10*3/uL (ref 0.00–0.07)
Basophils Absolute: 0 10*3/uL (ref 0.0–0.1)
Basophils Relative: 0 %
Eosinophils Absolute: 0 10*3/uL (ref 0.0–0.5)
Eosinophils Relative: 0 %
HCT: 37.3 % — ABNORMAL LOW (ref 39.0–52.0)
Hemoglobin: 12.3 g/dL — ABNORMAL LOW (ref 13.0–17.0)
Immature Granulocytes: 0 %
Lymphocytes Relative: 13 %
Lymphs Abs: 0.8 10*3/uL (ref 0.7–4.0)
MCH: 29.1 pg (ref 26.0–34.0)
MCHC: 33 g/dL (ref 30.0–36.0)
MCV: 88.4 fL (ref 80.0–100.0)
Monocytes Absolute: 0.7 10*3/uL (ref 0.1–1.0)
Monocytes Relative: 11 %
Neutro Abs: 4.4 10*3/uL (ref 1.7–7.7)
Neutrophils Relative %: 76 %
Platelets: 221 10*3/uL (ref 150–400)
RBC: 4.22 MIL/uL (ref 4.22–5.81)
RDW: 12.4 % (ref 11.5–15.5)
WBC: 5.8 10*3/uL (ref 4.0–10.5)
nRBC: 0 % (ref 0.0–0.2)

## 2018-05-19 LAB — COMPREHENSIVE METABOLIC PANEL
ALT: 11 U/L (ref 0–44)
AST: 14 U/L — ABNORMAL LOW (ref 15–41)
Albumin: 3.6 g/dL (ref 3.5–5.0)
Alkaline Phosphatase: 78 U/L (ref 38–126)
Anion gap: 8 (ref 5–15)
BUN: 8 mg/dL (ref 6–20)
CO2: 26 mmol/L (ref 22–32)
Calcium: 8.7 mg/dL — ABNORMAL LOW (ref 8.9–10.3)
Chloride: 104 mmol/L (ref 98–111)
Creatinine, Ser: 0.66 mg/dL (ref 0.61–1.24)
GFR calc Af Amer: >60 mL/min (ref >60)
GFR calc non Af Amer: >60 mL/min (ref >60)
Glucose, Bld: 96 mg/dL (ref 70–99)
Potassium: 3.6 mmol/L (ref 3.5–5.1)
Sodium: 138 mmol/L (ref 135–145)
Total Bilirubin: 0.7 mg/dL (ref 0.3–1.2)
Total Protein: 6.7 g/dL (ref 6.5–8.1)

## 2018-05-19 LAB — URINALYSIS, ROUTINE W REFLEX MICROSCOPIC
Glucose, UA: NEGATIVE mg/dL
Hgb urine dipstick: NEGATIVE
Ketones, ur: NEGATIVE mg/dL
Leukocytes,Ua: NEGATIVE
Nitrite: NEGATIVE
Protein, ur: NEGATIVE mg/dL
Specific Gravity, Urine: 1.01 (ref 1.005–1.030)
pH: 7.5 (ref 5.0–8.0)

## 2018-05-19 LAB — RAPID URINE DRUG SCREEN, HOSP PERFORMED
Amphetamines: NOT DETECTED
Barbiturates: NOT DETECTED
Benzodiazepines: NOT DETECTED
Cocaine: NOT DETECTED
Opiates: NOT DETECTED
Tetrahydrocannabinol: NOT DETECTED

## 2018-05-19 LAB — SEDIMENTATION RATE: Sed Rate: 39 mm/hr — ABNORMAL HIGH (ref 0–16)

## 2018-05-19 LAB — C-REACTIVE PROTEIN: CRP: 9.3 mg/dL — ABNORMAL HIGH (ref <1.0)

## 2018-05-19 MED ORDER — METHOCARBAMOL 1000 MG/10ML IJ SOLN
500.0000 mg | Freq: Once | INTRAMUSCULAR | Status: AC
  Administered 2018-05-19: 500 mg via INTRAMUSCULAR
  Filled 2018-05-19: qty 10

## 2018-05-19 MED ORDER — KETOROLAC TROMETHAMINE 60 MG/2ML IM SOLN
60.0000 mg | Freq: Once | INTRAMUSCULAR | Status: AC
  Administered 2018-05-19: 19:00:00 60 mg via INTRAMUSCULAR
  Filled 2018-05-19: qty 2

## 2018-05-19 NOTE — ED Notes (Signed)
Pt requesting pain medication; pt informed he is waiting to see EDP and orders will be placed by EDP of necessary

## 2018-05-19 NOTE — ED Triage Notes (Signed)
Lower back pain since Tuesday night.  No known injury.  No fever.  No numbness,  Pain radiates to right hip and leg.  Denies elimination problems.  Can ambulated but painful.

## 2018-05-19 NOTE — ED Notes (Signed)
Pt amb to BR

## 2018-05-19 NOTE — ED Provider Notes (Signed)
[facility] EMERGENCY DEPARTMENT Provider Note   CSN: [id number] Arrival date & time: 05/19/18  1628    History   Chief Complaint Chief Complaint  Patient presents with   Back Pain    HPI Samuel Andrade is a 38 y.o. male.     HPI Patient reports he has had back pain for 4 days that has become very severe.  He reports that the pain is much worse if he tries to roll turn or move.  He reports it does radiate to his right hip and right leg.  It is sharp in quality.  He can walk but he reports it is painful.  He denies any bowel or bladder dysfunction.  He reports the pain really goes all the way from his mid back down to his buttocks.  No upper back pain.  No neck pain.  No upper extremity pain or weakness.  Patient does have history of heroin abuse.  He reports last use was a month ago.  Denies he is had fever or chills.  No cough, shortness of breath, nausea or vomiting. Past Medical History:  Diagnosis Date   Anxiety    Depression    Neuromuscular disorder (HCC)    Opioid abuse (HCC)    Opioid overdose (HCC)    Substance abuse (HCC)     Patient Active Problem List   Diagnosis Date Noted   Acute low back pain 05/19/2018   History of substance abuse (HCC) 11/14/2017   Hepatitis C virus infection without hepatic coma 08/19/2014   Tobacco abuse 08/19/2014    Past Surgical History:  Procedure Laterality Date   FRACTURE SURGERY     HERNIA REPAIR     SPINE SURGERY          Home Medications    Prior to Admission medications   Not on File    Family History Family History  Problem Relation Age of Onset   Hypertension Father    Healthy Mother    Stroke Maternal Grandmother    Mental illness Maternal Grandmother    Stroke Maternal Grandfather    Heart disease Maternal Grandfather    Stroke Paternal Grandmother    Diabetes Paternal Grandfather     Social History Social History   Tobacco Use   Smoking status: Current Every Day  Smoker    Packs/day: 0.50    Years: 9.00    Pack years: 4.50    Types: Cigarettes   Smokeless tobacco: Never Used  Substance Use Topics   Alcohol use: Yes    Comment: once a week   Drug use: Yes    Types: IV    Comment: heroin, Xanax     Allergies   Patient has no known allergies.   Review of Systems Review of Systems 10 Systems reviewed and are negative for acute change except as noted in the HPI.   Physical Exam Updated Vital Signs BP 139/82    Pulse 92    Temp 98.3 F (36.8 C)    Resp 16    Ht 6\' 4"  (1.93 m)    Wt 97.5 kg    SpO2 100%    BMI 26.17 kg/m   Physical Exam Constitutional:      Comments: Patient is alert and nontoxic.  He appears uncomfortable.  Mental status is clear.  No respiratory distress.  HENT:     Head: Normocephalic and atraumatic.     Mouth/Throat:     Mouth: Mucous membranes are moist.  Pharynx: Oropharynx is clear.  Eyes:     Extraocular Movements: Extraocular movements intact.     Pupils: Pupils are equal, round, and reactive to light.  Neck:     Musculoskeletal: Neck supple. No neck rigidity or muscular tenderness.  Cardiovascular:     Rate and Rhythm: Normal rate and regular rhythm.     Pulses: Normal pulses.     Heart sounds: Normal heart sounds.  Pulmonary:     Effort: Pulmonary effort is normal.     Breath sounds: Normal breath sounds.  Abdominal:     General: There is no distension.     Palpations: Abdomen is soft.     Tenderness: There is no abdominal tenderness. There is no guarding.  Musculoskeletal:     Comments: Patient endorses tenderness to palpation from his low thoracic spine at about T11 to the sacrum.  He does have a well-healed surgical scar in the lower back.  He endorses pain with position change from flat to rolling on his side.  Patient does have intact lower extremity strength.  5\5 for plantar extension and flexion.  2+ patellar reflexes symmetrically.  Skin is warm and dry without any peripheral edema.   Patient has ambulated to the bathroom independently with stable gait.  Both upper extremities have track marks that appear older.  There is no areas of erythema, fresh appearing eschar or cellulitis.  Skin:    General: Skin is warm and dry.  Neurological:     General: No focal deficit present.     Mental Status: He is oriented to person, place, and time.     Sensory: No sensory deficit.     Coordination: Coordination normal.     Gait: Gait normal.  Psychiatric:        Mood and Affect: Mood normal.      ED Treatments / Results  Labs (all labs ordered are listed, but only abnormal results are displayed) Labs Reviewed  COMPREHENSIVE METABOLIC PANEL - Abnormal; Notable for the following components:      Result Value   Calcium 8.7 (*)    AST 14 (*)    All other components within normal limits  CBC WITH DIFFERENTIAL/PLATELET - Abnormal; Notable for the following components:   Hemoglobin 12.3 (*)    HCT 37.3 (*)    All other components within normal limits  SEDIMENTATION RATE - Abnormal; Notable for the following components:   Sed Rate 39 (*)    All other components within normal limits  URINALYSIS, ROUTINE W REFLEX MICROSCOPIC - Abnormal; Notable for the following components:   Bilirubin Urine MODERATE (*)    All other components within normal limits  RAPID URINE DRUG SCREEN, HOSP PERFORMED  C-REACTIVE PROTEIN    EKG None  Radiology No results found.  Procedures Procedures (including critical care time)  Medications Ordered in ED Medications  ketorolac (TORADOL) injection 60 mg (60 mg Intramuscular Given 05/19/18 1842)  methocarbamol (ROBAXIN) injection 500 mg (500 mg Intramuscular Given 05/19/18 1844)     Initial Impression / Assessment and Plan / ED Course  I have reviewed the triage vital signs and the nursing notes.  Pertinent labs & imaging results that were available during my care of the patient were reviewed by me and considered in my medical decision making  (see chart for details).  Clinical Course as of May 18 2121  Fri May 19, 2018  2112 Recheck: Patient reports he does feel improved and has been up and ambulatory to the  bathroom.  He reports that he wants to have his father drive him to the emergency department.  Patient declines ambulance transport.  Patient has received IM dose of Toradol and Robaxin.  Mental status clear, stable for transport by patient's father to [facility] emergency department.   [MP]    Clinical Course User Index [MP] Arby BarrettePfeiffer, Adon Gehlhausen, MD     Consult: Reviewed with [facility] Melene Planan Floyd at [facility] department accepts for transfer for MRI.  Patient presents with report of severe back pain from lower thoracic back to sacrum.  Patient has several comorbid conditions.  He does have history of IV drug abuse.  He does have track marks visible.  There is concern for potential risk of epidural abscess/discitis in light of history of IV drug abuse.  Patient is afebrile without leukocytosis and nontoxic.  I feel he is stable to get MRI and determination at that time with IV antibiotics are indicated.  Patient does not have neurologic dysfunction.  MRI also helpful to rule out disc herniation although with pain that involves most of the lower central back, I have lower suspicion for acute disc herniation.  Patient has gotten significant improvement of symptoms with IM Toradol and Robaxin.  Patient's father will drive [facility] to [facility] for MRI to rule out emergent etiology for back pain.  If no emergent etiology identified by MRI, patient stable for discharge with treatment with NSAIDs and muscle relaxers.  Final Clinical Impressions(s) / ED Diagnoses   Final diagnoses:  Acute midline low back pain with right-sided sciatica  Drug abuse, IV [facility](HCC)    ED Discharge Orders    None       Arby BarrettePfeiffer, Jaydyn Bozzo, MD 05/19/18 2123

## 2018-05-19 NOTE — Progress Notes (Signed)
Virtual Visit via Video Note  I connected with [name] on 05/19/18 at  3:30 PM EDT by a video enabled telemedicine application and verified that I am speaking with the correct person using two identifiers.  Location: Patient: home Provider:    Established Patient Office Visit  Subjective:  Patient ID: Samuel Andrade, male    DOB: [birth year]  Age: 38 y.o. MRN: [id number]  CC:  Chief Complaint  Patient presents with  . Back Pain    HPI Samuel Andrade presents for evaluation and treatment of a 2-day history of severe lower back pain that he says is also occurring in his abdominal muscles and hip flexors.  He says that the pain runs up and down his spinal cord.  There was no injury.  He is not currently working his been at home in his parents house over the last few weeks.  Assures me that he remains clean and sober and does not wish to have a narcotic.  Patient tells me that he is no longer being seen at behavioral health.  Patient was somewhat unclear as to why he missed 2 recent scheduled appointments with me.  Past Medical History:  Diagnosis Date  . Anxiety   . Depression   . Neuromuscular disorder [facility])     Past Surgical History:  Procedure Laterality Date  . FRACTURE SURGERY    . HERNIA REPAIR    . SPINE SURGERY      Family History  Problem Relation Age of Onset  . Hypertension Father   . Healthy Mother   . Stroke Maternal Grandmother   . Mental illness Maternal Grandmother   . Stroke Maternal Grandfather   . Heart disease Maternal Grandfather   . Stroke Paternal Grandmother   . Diabetes Paternal Grandfather     Social History   Socioeconomic History  . Marital status: Single    Spouse name: Not on file  . Number of children: Not on file  . Years of education: Not on file  . Highest education level: Not on file  Occupational History  . Not on file  Social Needs  . Financial resource strain: Not on file  . Food insecurity:    Worry: Not on file     Inability: Not on file  . Transportation needs:    Medical: Not on file    Non-medical: Not on file  Tobacco Use  . Smoking status: Current Every Day Smoker    Packs/day: 0.50    Years: 9.00    Pack years: 4.50    Types: Cigarettes  . Smokeless tobacco: Never Used  Substance and Sexual Activity  . Alcohol use: Yes    Comment: once a week  . Drug use: Yes    Types: IV    Comment: heroin, Xanax  . Sexual activity: Not Currently  Lifestyle  . Physical activity:    Days per week: Not on file    Minutes per session: Not on file  . Stress: Not on file  Relationships  . Social connections:    Talks on phone: Not on file    Gets together: Not on file    Attends religious service: Not on file    Active member of club or organization: Not on file    Attends meetings of clubs or organizations: Not on file    Relationship status: Not on file  . Intimate partner violence:    Fear of current or ex partner: Not on file  Emotionally abused: Not on file    Physically abused: Not on file    Forced sexual activity: Not on file  Other Topics Concern  . Not on file  Social History Narrative  . Not on file    No outpatient medications prior to visit.   No facility-administered medications prior to visit.     No Known Allergies  ROS Review of Systems  Cardiovascular: Negative.   Gastrointestinal: Positive for abdominal pain.  Musculoskeletal: Positive for back pain and myalgias.  Neurological: Negative for weakness and numbness.      Objective:    Physical Exam  Constitutional: He is oriented to person, place, and time. He appears well-developed and well-nourished. He appears distressed.  Patient is recumbent and lying in his bed at home.  He is unable to sit up and show me exactly where he is hurting.  He is writhing in pain.  Eyes: Conjunctivae are normal. Right eye exhibits no discharge. Left eye exhibits no discharge. No scleral icterus.  Pulmonary/Chest: Effort  normal.  Neurological: He is alert and oriented to person, place, and time.  Skin: He is not diaphoretic.  Psychiatric: He has a normal mood and affect. His behavior is normal.    Ht 6\' 3"  (1.905 m)   BMI 25.50 kg/m  Wt Readings from Last 3 Encounters:  03/13/18 204 lb (92.5 kg)  11/28/17 214 lb (97.1 kg)  11/14/17 212 lb (96.2 kg)     There are no preventive care reminders to display for this patient.  There are no preventive care reminders to display for this patient.  No results found for: TSH Lab Results  Component Value Date   WBC 5.4 11/28/2017   HGB 13.7 11/28/2017   HCT 40.0 11/28/2017   MCV 87.0 11/28/2017   PLT 252 11/28/2017   Lab Results  Component Value Date   NA 138 01/24/2018   K 4.7 01/24/2018   CO2 28 01/24/2018   GLUCOSE 81 01/24/2018   BUN 8 01/24/2018   CREATININE 0.84 01/24/2018   BILITOT 0.4 03/13/2018   ALKPHOS 80 07/26/2014   AST 21 03/13/2018   ALT 12 03/13/2018   PROT 6.5 03/13/2018   ALBUMIN 3.7 07/26/2014   CALCIUM 9.4 01/24/2018   No results found for: CHOL No results found for: HDL No results found for: LDLCALC No results found for: TRIG No results found for: CHOLHDL No results found for: [id number]    Assessment & Plan:   Problem List Items Addressed This Visit    None      No orders of the defined types were placed in this encounter.   Follow-up: No follow-ups on file.    Mliss SaxWilliam Alfred , MD   I discussed the limitations of evaluation and management by telemedicine and the availability of in person appointments. The patient expressed understanding and agreed to proceed.  History of Present Illness:    Observations/Objective:   Assessment and Plan:   Follow Up Instructions:    I discussed the assessment and treatment plan with the patient. The patient was provided an opportunity to ask questions and all were answered. The patient agreed with the plan and demonstrated an understanding of the  instructions.   The patient was advised to call back or seek an in-person evaluation if the symptoms worsen or if the condition fails to improve as anticipated.  I provided 10 minutes of non-face-to-face time during this encounter.   Patient's mother is there with him.  I advised her  to take him to the emergency room.

## 2018-05-20 ENCOUNTER — Emergency Department (HOSPITAL_COMMUNITY): Payer: BLUE CROSS/BLUE SHIELD

## 2018-05-20 DIAGNOSIS — M5127 Other intervertebral disc displacement, lumbosacral region: Secondary | ICD-10-CM | POA: Diagnosis not present

## 2018-05-20 DIAGNOSIS — M47814 Spondylosis without myelopathy or radiculopathy, thoracic region: Secondary | ICD-10-CM | POA: Diagnosis not present

## 2018-05-20 DIAGNOSIS — M5124 Other intervertebral disc displacement, thoracic region: Secondary | ICD-10-CM | POA: Diagnosis not present

## 2018-05-20 DIAGNOSIS — M5126 Other intervertebral disc displacement, lumbar region: Secondary | ICD-10-CM | POA: Diagnosis not present

## 2018-05-20 MED ORDER — HYDROMORPHONE HCL 1 MG/ML IJ SOLN
1.0000 mg | Freq: Once | INTRAMUSCULAR | Status: AC
  Administered 2018-05-20: 04:00:00 1 mg via INTRAVENOUS
  Filled 2018-05-20: qty 1

## 2018-05-20 MED ORDER — PREDNISONE 10 MG (21) PO TBPK: ORAL_TABLET | Freq: Every day | ORAL | 0 refills | Status: DC

## 2018-05-20 MED ORDER — METHOCARBAMOL 500 MG PO TABS: 500.0000 mg | ORAL_TABLET | Freq: Two times a day (BID) | ORAL | 0 refills | Status: AC

## 2018-05-20 MED ORDER — LORAZEPAM 2 MG/ML IJ SOLN
0.5000 mg | Freq: Once | INTRAMUSCULAR | Status: AC
  Administered 2018-05-20: 03:00:00 0.5 mg via INTRAVENOUS
  Filled 2018-05-20: qty 1

## 2018-05-20 MED ORDER — GADOBUTROL 1 MMOL/ML IV SOLN
9.0000 mL | Freq: Once | INTRAVENOUS | Status: AC | PRN
  Administered 2018-05-20: 05:00:00 9 mL via INTRAVENOUS

## 2018-05-20 MED ORDER — MORPHINE SULFATE (PF) 4 MG/ML IV SOLN
4.0000 mg | Freq: Once | INTRAVENOUS | Status: AC
  Administered 2018-05-20: 03:00:00 4 mg via INTRAVENOUS
  Filled 2018-05-20: qty 1

## 2018-05-20 NOTE — ED Provider Notes (Signed)
Pt transferred ED to ED from [facility]. Briefly, pt seen for eval of back pain with radiation to the RLE. No neuro deficits but does have h/o IVDU therefore there was concern for possible epidural abscess and he was transferred here for MRI imaging.   Physical Exam  BP (!) 109/57   Pulse 81   Temp 98.8 F (37.1 C) (Oral)   Resp 18   Ht  (1.93 m)   Wt 97.5 kg   SpO2 94%   BMI 26.17 kg/m   Physical Exam Constitutional:      General: He is not in acute distress.    Appearance: He is well-developed.  Eyes:     Conjunctiva/sclera: Conjunctivae normal.  Cardiovascular:     Rate and Rhythm: Normal rate.  Pulmonary:     Effort: Pulmonary effort is normal.  Musculoskeletal:     Comments: Midline scar over the lumbar spine. States he has pain to lower back but has no focal TTP on exam. Moving all extremities. 5/5 strength to BUE and BLE. Normal sensation throughout. Able to stand and ambulate thought has limp.  No upper thoracic midline tenderness.  Skin:    General: Skin is warm and dry.  Neurological:     Mental Status: He is alert and oriented to person, place, and time.     ED Course/Procedures   Clinical Course as of May 19 653  Fri May 19, 2018  2112 Recheck: Patient reports he does feel improved and has been up and ambulatory to the bathroom.  He reports that he wants to have his father drive him to the emergency department.  Patient declines ambulance transport.  Patient has received IM dose of Toradol and Robaxin.  Mental status clear, stable for transport by patient's father to [facility] emergency department.   [MP]    Clinical Course User Index [MP] Arby Barrette, MD    Procedures  Results for orders placed or performed during the hospital encounter of 05/19/18  Comprehensive metabolic panel  Result Value Ref Range   Sodium 138 135 - 145 mmol/L   Potassium 3.6 3.5 - 5.1 mmol/L   Chloride 104 98 - 111 mmol/L   CO2 26 22 - 32 mmol/L   Glucose,  Bld 96 70 - 99 mg/dL   BUN 8 6 - 20 mg/dL   Creatinine, Ser 1.61 0.61 - 1.24 mg/dL   Calcium 8.7 (L) 8.9 - 10.3 mg/dL   Total Protein 6.7 6.5 - 8.1 g/dL   Albumin 3.6 3.5 - 5.0 g/dL   AST 14 (L) 15 - 41 U/L   ALT 11 0 - 44 U/L   Alkaline Phosphatase 78 38 - 126 U/L   Total Bilirubin 0.7 0.3 - 1.2 mg/dL   GFR calc non Af Amer >60 >60 mL/min   GFR calc Af Amer >60 >60 mL/min   Anion gap 8 5 - 15  CBC with Differential  Result Value Ref Range   WBC 5.8 4.0 - 10.5 K/uL   RBC 4.22 4.22 - 5.81 MIL/uL   Hemoglobin 12.3 (L) 13.0 - 17.0 g/dL   HCT 09.6 (L) 04.5 - 40.9 %   MCV 88.4 80.0 - 100.0 fL   MCH 29.1 26.0 - 34.0 pg   MCHC 33.0 30.0 - 36.0 g/dL   RDW 81.1 91.4 - 78.2 %   Platelets 221 150 - 400 K/uL   nRBC 0.0 0.0 - 0.2 %   Neutrophils Relative % 76 %  Neutro Abs 4.4 1.7 - 7.7 K/uL   Lymphocytes Relative 13 %   Lymphs Abs 0.8 0.7 - 4.0 K/uL   Monocytes Relative 11 %   Monocytes Absolute 0.7 0.1 - 1.0 K/uL   Eosinophils Relative 0 %   Eosinophils Absolute 0.0 0.0 - 0.5 K/uL   Basophils Relative 0 %   Basophils Absolute 0.0 0.0 - 0.1 K/uL   Immature Granulocytes 0 %   Abs Immature Granulocytes 0.01 0.00 - 0.07 K/uL  C-reactive protein  Result Value Ref Range   CRP 9.3 (H) <1.0 mg/dL  Sedimentation rate  Result Value Ref Range   Sed Rate 39 (H) 0 - 16 mm/hr  Urinalysis, Routine w reflex microscopic  Result Value Ref Range   Color, Urine YELLOW YELLOW   APPearance CLEAR CLEAR   Specific Gravity, Urine 1.010 1.005 - 1.030   pH 7.5 5.0 - 8.0   Glucose, UA NEGATIVE NEGATIVE mg/dL   Hgb urine dipstick NEGATIVE NEGATIVE   Bilirubin Urine MODERATE (A) NEGATIVE   Ketones, ur NEGATIVE NEGATIVE mg/dL   Protein, ur NEGATIVE NEGATIVE mg/dL   Nitrite NEGATIVE NEGATIVE   Leukocytes,Ua NEGATIVE NEGATIVE  Urine rapid drug screen (hosp performed)  Result Value Ref Range   Opiates NONE DETECTED NONE DETECTED   Cocaine NONE DETECTED NONE DETECTED   Benzodiazepines NONE DETECTED  NONE DETECTED   Amphetamines NONE DETECTED NONE DETECTED   Tetrahydrocannabinol NONE DETECTED NONE DETECTED   Barbiturates NONE DETECTED NONE DETECTED   Mr Thoracic Spine W Wo Contrast  Result Date: 05/20/2018 CLINICAL DATA:  38 y/o M; 4 days of back pain that has become severe. Pain radiates to the right hip and right leg. History of lumbar surgery and substance abuse. EXAM: MRI THORACIC AND LUMBAR SPINE WITHOUT AND WITH CONTRAST TECHNIQUE: Multiplanar and multiecho pulse sequences of the thoracic and lumbar spine were obtained without and with intravenous contrast. CONTRAST:  9 cc Gadavist. COMPARISON:  None. FINDINGS: MRI THORACIC SPINE FINDINGS Alignment:  Physiologic. Vertebrae: No fracture, evidence of discitis, or bone lesion. No enhancement. Chronic T11 superior endplate Schmorl's node. Cord:  Normal signal and morphology.  No enhancement. Paraspinal and other soft tissues: Negative. Disc levels: T4-5 right central disc protrusion with contact on the right anterior cord and mild cord flattening. No significant foraminal or spinal canal stenosis. T5-6 right central disc protrusion with contact on the right anterior cord and mild cord flattening. No significant foraminal or spinal canal stenosis. T6-7 small disc bulge with ventral thecal sac effacement. No significant foraminal or spinal canal stenosis. T7-8 small central disc protrusion with ventral thecal sac effacement. No significant foraminal or spinal canal stenosis. T9-10 small right central disc protrusion with ventral thecal sac effacement. No significant foraminal or spinal canal stenosis. MRI LUMBAR SPINE FINDINGS Segmentation:  Grade 1 L4-5 retrolisthesis. Alignment:  Physiologic. Vertebrae: No fracture, evidence of discitis, or bone lesion. No abnormal enhancement. Conus medullaris: Extends to the L1-2 level and appears normal. No abnormal enhancement. Paraspinal and other soft tissues: Bilateral renal cysts measuring up to 19 mm in the  right kidney. Disc levels: L1-2: No significant disc displacement, foraminal stenosis, or canal stenosis. L2-3: No significant disc displacement, foraminal stenosis, or canal stenosis. L3-4: No significant disc displacement. Mild facet hypertrophy. No foraminal or spinal canal stenosis. L4-5: Moderate central disc protrusion with annular fissure combined with facet and ligamentum flavum hypertrophy. The protrusion effaces the lateral recesses with disc contact on the descending L5 nerve roots bilaterally. There is mild bilateral  foraminal stenosis and mild-to-moderate spinal canal stenosis. L5-S1: Posterior endplate marginal osteophytes, mild facet hypertrophy, and a central disc extrusion with slight inferior migration into the anterior epidural space of the right lateral recess (series 36, image 9) the extruded disc contacts the descending right S1 nerve root in the right lateral recess. Additionally, endplate marginal osteophytes and facet hypertrophy result in mild bilateral foraminal stenosis. No significant spinal canal stenosis. Chronic left hemilaminectomy postsurgical changes IMPRESSION: MRI thoracic spine: 1. No acute osseous or cord signal abnormality or enhancement. 2. Discogenic degenerative changes from T4 through T10 with small disc protrusions. The protrusion contacts the right anterior cord with mild cord flattening the T4-5 and T5-6 levels. No significant foraminal or spinal canal stenosis. MRI lumbar spine: 1. No acute osseous, cord, or cauda carina signal abnormality. No abnormal enhancement. 2. L4-5 disc protrusion effacing the lateral recesses with disc contact on descending L5 nerve roots, mild bilateral foraminal stenosis, and mild-to-moderate spinal canal stenosis. 3. L5-S1 central disc herniation with small inferior extrusion into the right lateral recess contacting the descending right S1 nerve root. Mild bilateral foraminal stenosis. Electronically Signed   By: Mitzi Hansen  M.D.   On: 05/20/2018 06:15   Mr Lumbar Spine W Wo Contrast  Result Date: 05/20/2018 CLINICAL DATA:  37 y/o M; 4 days of back pain that has become severe. Pain radiates to the right hip and right leg. History of lumbar surgery and substance abuse. EXAM: MRI THORACIC AND LUMBAR SPINE WITHOUT AND WITH CONTRAST TECHNIQUE: Multiplanar and multiecho pulse sequences of the thoracic and lumbar spine were obtained without and with intravenous contrast. CONTRAST:  9 cc Gadavist. COMPARISON:  None. FINDINGS: MRI THORACIC SPINE FINDINGS Alignment:  Physiologic. Vertebrae: No fracture, evidence of discitis, or bone lesion. No enhancement. Chronic T11 superior endplate Schmorl's node. Cord:  Normal signal and morphology.  No enhancement. Paraspinal and other soft tissues: Negative. Disc levels: T4-5 right central disc protrusion with contact on the right anterior cord and mild cord flattening. No significant foraminal or spinal canal stenosis. T5-6 right central disc protrusion with contact on the right anterior cord and mild cord flattening. No significant foraminal or spinal canal stenosis. T6-7 small disc bulge with ventral thecal sac effacement. No significant foraminal or spinal canal stenosis. T7-8 small central disc protrusion with ventral thecal sac effacement. No significant foraminal or spinal canal stenosis. T9-10 small right central disc protrusion with ventral thecal sac effacement. No significant foraminal or spinal canal stenosis. MRI LUMBAR SPINE FINDINGS Segmentation:  Grade 1 L4-5 retrolisthesis. Alignment:  Physiologic. Vertebrae: No fracture, evidence of discitis, or bone lesion. No abnormal enhancement. Conus medullaris: Extends to the L1-2 level and appears normal. No abnormal enhancement. Paraspinal and other soft tissues: Bilateral renal cysts measuring up to 19 mm in the right kidney. Disc levels: L1-2: No significant disc displacement, foraminal stenosis, or canal stenosis. L2-3: No significant disc  displacement, foraminal stenosis, or canal stenosis. L3-4: No significant disc displacement. Mild facet hypertrophy. No foraminal or spinal canal stenosis. L4-5: Moderate central disc protrusion with annular fissure combined with facet and ligamentum flavum hypertrophy. The protrusion effaces the lateral recesses with disc contact on the descending L5 nerve roots bilaterally. There is mild bilateral foraminal stenosis and mild-to-moderate spinal canal stenosis. L5-S1: Posterior endplate marginal osteophytes, mild facet hypertrophy, and a central disc extrusion with slight inferior migration into the anterior epidural space of the right lateral recess (series 36, image 9) the extruded disc contacts the descending right S1 nerve root  in the right lateral recess. Additionally, endplate marginal osteophytes and facet hypertrophy result in mild bilateral foraminal stenosis. No significant spinal canal stenosis. Chronic left hemilaminectomy postsurgical changes IMPRESSION: MRI thoracic spine: 1. No acute osseous or cord signal abnormality or enhancement. 2. Discogenic degenerative changes from T4 through T10 with small disc protrusions. The protrusion contacts the right anterior cord with mild cord flattening the T4-5 and T5-6 levels. No significant foraminal or spinal canal stenosis. MRI lumbar spine: 1. No acute osseous, cord, or cauda carina signal abnormality. No abnormal enhancement. 2. L4-5 disc protrusion effacing the lateral recesses with disc contact on descending L5 nerve roots, mild bilateral foraminal stenosis, and mild-to-moderate spinal canal stenosis. 3. L5-S1 central disc herniation with small inferior extrusion into the right lateral recess contacting the descending right S1 nerve root. Mild bilateral foraminal stenosis. Electronically Signed   By: Mitzi HansenLance  Furusawa-Stratton M.D.   On: 05/20/2018 06:15     MDM   Pt presenting to the ED for back pain for several days. No neuro deficits, but there was  concern for possible epidural abscess/discitis 2/2 known h/o IVDU. [facility]   Normal neuro exam.  No red flag signs or symptoms.  Is ambulatory in the ED.  Labs reviewed. Notable for mild anemia. Elevated CRP at 9.3. Sed rate elevated at 39. UA with moderate bilirubin. UDS negative.  MRI thoracic spine: 1. No acute osseous or cord signal abnormality or enhancement. 2. Discogenic degenerative changes from T4 through T10 with small disc protrusions. The protrusion contacts the right anterior cord with mild cord flattening the T4-5 and T5-6 levels. No significant foraminal or spinal canal stenosis.   MRI lumbar spine: 1. No acute osseous, cord, or cauda carina signal abnormality. No abnormal enhancement. 2. L4-5 disc protrusion effacing the lateral recesses with disc contact on descending L5 nerve roots, mild bilateral foraminal stenosis, and mild-to-moderate spinal canal stenosis. 3. L5-S1 central disc herniation with small inferior extrusion into the right lateral recess contacting the descending right S1 nerve root. Mild bilateral foraminal stenosis.  On reassessment discussed results and plan for neurosurgery f/u. Will give rx for steroid taper and muscle relaxers.  Advised not to drive or drink alcohol or operate machinery while taking Robaxin.  Gave strict return precautions for any neurologic deficits or any new or worsening symptoms.  He voices understanding of the plan and reasons to return.  All questions answered.  Patient stable for discharge.  Case discussed with Dr. Elesa MassedWard who personally reviewed MRI results and is in agreement with plan.        Karrie MeresCouture, Gordy Goar S, PA-C 05/20/18 [phone]    Ward, Layla MawKristen N, DO 05/20/18 [phone]

## 2018-05-20 NOTE — ED Notes (Signed)
Pt sleepong

## 2018-05-20 NOTE — ED Notes (Signed)
Mri will send for the pt

## 2018-05-20 NOTE — ED Notes (Signed)
More calm

## 2018-05-20 NOTE — ED Notes (Signed)
Pt returned from mri asking for something to drink  Not yet waiktng for his results of mri

## 2018-05-20 NOTE — ED Notes (Signed)
Pt to mri  Pt requesting robaxin

## 2018-05-20 NOTE — Discharge Instructions (Addendum)
Take the prednisone and robaxin as directed.  You were given a prescription for Robaxin which is a muscle relaxer.  You should not drive, work, or operate machinery while taking this medication as it can make you very drowsy.    Call the neurosurgery office on Monday to schedule an appointment for follow up.  Return to the emergency department immediately if you experience any back pain associated with fevers, loss of control of your bowels/bladder, weakness/numbness to your legs, numbness to your groin area, inability to walk, or inability to urinate.

## 2018-06-01 DIAGNOSIS — Z6826 Body mass index (BMI) 26.0-26.9, adult: Secondary | ICD-10-CM | POA: Diagnosis not present

## 2018-06-01 DIAGNOSIS — R030 Elevated blood-pressure reading, without diagnosis of hypertension: Secondary | ICD-10-CM | POA: Diagnosis not present

## 2018-06-01 DIAGNOSIS — M549 Dorsalgia, unspecified: Secondary | ICD-10-CM | POA: Diagnosis not present

## 2018-07-18 ENCOUNTER — Encounter: Payer: Self-pay | Admitting: Family Medicine

## 2018-07-18 ENCOUNTER — Other Ambulatory Visit: Payer: BC Managed Care – PPO

## 2018-07-18 ENCOUNTER — Ambulatory Visit (INDEPENDENT_AMBULATORY_CARE_PROVIDER_SITE_OTHER): Payer: BC Managed Care – PPO | Admitting: Family Medicine

## 2018-07-18 ENCOUNTER — Other Ambulatory Visit: Payer: Self-pay

## 2018-07-18 VITALS — BP 128/80 | HR 78 | Ht 76.0 in | Wt 194.1 lb

## 2018-07-18 DIAGNOSIS — M5126 Other intervertebral disc displacement, lumbar region: Secondary | ICD-10-CM

## 2018-07-18 DIAGNOSIS — Z8619 Personal history of other infectious and parasitic diseases: Secondary | ICD-10-CM | POA: Diagnosis not present

## 2018-07-18 DIAGNOSIS — B1920 Unspecified viral hepatitis C without hepatic coma: Secondary | ICD-10-CM | POA: Diagnosis not present

## 2018-07-18 MED ORDER — GABAPENTIN 100 MG PO CAPS: ORAL_CAPSULE | ORAL | 0 refills | Status: DC

## 2018-07-18 MED ORDER — GABAPENTIN 100 MG PO CAPS: ORAL_CAPSULE | ORAL | 1 refills | Status: DC

## 2018-07-18 NOTE — Progress Notes (Signed)
Established Patient Office Visit  Subjective:  Patient ID: Samuel Andrade, male    DOB: 1980-06-14  Age: 38 y.o. MRN: [id number]  CC:  Chief Complaint  Patient presents with  . Back Pain    HPI Samuel Andrade presents for follow-up of his diagonal there is localized mostly in the right pelvic area.  Patient is stiff in his lower back with limited range of motion.  He denies numbness or tingling or weakness in his lower extremities.  There is no change in his bowel or bladder functions.  MRI back in the May of this year did show an L4-L5 disc protruding with effacement of the exiting nerves on both sides.  L5-S1 disc effaced the S1 nerve on the right.  Status post recent follow-up neurosurgery tells me he did not recommend surgery at this time.  He bought a new mattress this seems to help.  It is gotten better since May but the stiffness persists.  The pain that he does have is burning in nature.  Patient continues his journey in recovery.  Past Medical History:  Diagnosis Date  . Anxiety   . Depression   . Neuromuscular disorder ([place])   . Opioid abuse ([place])   . Opioid overdose ([place])   . Substance abuse [facility])     Past Surgical History:  Procedure Laterality Date  . FRACTURE SURGERY    . HERNIA REPAIR    . SPINE SURGERY      Family History  Problem Relation Age of Onset  . Hypertension Father   . Healthy Mother   . Stroke Maternal Grandmother   . Mental illness Maternal Grandmother   . Stroke Maternal Grandfather   . Heart disease Maternal Grandfather   . Stroke Paternal Grandmother   . Diabetes Paternal Grandfather     Social History   Socioeconomic History  . Marital status: Single    Spouse name: Not on file  . Number of children: Not on file  . Years of education: Not on file  . Highest education level: Not on file  Occupational History  . Not on file  Social Needs  . Financial resource strain: Not on file  . Food insecurity    Worry: Not on file   Inability: Not on file  . Transportation needs    Medical: Not on file    Non-medical: Not on file  Tobacco Use  . Smoking status: Current Every Day Smoker    Packs/day: 0.50    Years: 9.00    Pack years: 4.50    Types: Cigarettes  . Smokeless tobacco: Never Used  Substance and Sexual Activity  . Alcohol use: Yes    Comment: once a week  . Drug use: Yes    Types: IV    Comment: heroin, Xanax  . Sexual activity: Not Currently  Lifestyle  . Physical activity    Days per week: Not on file    Minutes per session: Not on file  . Stress: Not on file  Relationships  . Social Herbalist on phone: Not on file    Gets together: Not on file    Attends religious service: Not on file    Active member of club or organization: Not on file    Attends meetings of clubs or organizations: Not on file    Relationship status: Not on file  . Intimate partner violence    Fear of current or ex partner: Not on file  Emotionally abused: Not on file    Physically abused: Not on file    Forced sexual activity: Not on file  Other Topics Concern  . Not on file  Social History Narrative  . Not on file    Outpatient Medications Prior to Visit  Medication Sig Dispense Refill  . predniSONE (STERAPRED UNI-PAK 21 TAB) 10 MG (21) TBPK tablet Take by mouth daily. Take 6 tabs by mouth daily  for 2 days, then 5 tabs for 2 days, then 4 tabs for 2 days, then 3 tabs for 2 days, 2 tabs for 2 days, then 1 tab by mouth daily for 2 days 42 tablet 0   No facility-administered medications prior to visit.     No Known Allergies  ROS Review of Systems  Constitutional: Negative.   Respiratory: Negative.   Cardiovascular: Negative.   Gastrointestinal: Negative.   Musculoskeletal: Positive for back pain and gait problem.  Neurological: Negative for weakness and numbness.  Hematological: Does not bruise/bleed easily.  Psychiatric/Behavioral: Negative.       Objective:    Physical Exam   Constitutional: He is oriented to person, place, and time. He appears well-developed and well-nourished. No distress.  HENT:  Head: Normocephalic and atraumatic.  Right Ear: External ear normal.  Left Ear: External ear normal.  Eyes: Conjunctivae are normal. Right eye exhibits no discharge. Left eye exhibits no discharge. No scleral icterus.  Neck: No JVD present. No tracheal deviation present.  Pulmonary/Chest: Effort normal. No stridor.  Neurological: He is alert and oriented to person, place, and time. He has normal strength.  Reflex Scores:      Patellar reflexes are 0 on the right side and 0 on the left side.      Achilles reflexes are 0 on the right side and 0 on the left side. SLRs caused lower back pain.   Skin: Skin is warm and dry. He is not diaphoretic.  Psychiatric: He has a normal mood and affect. His behavior is normal.    BP 128/80   Pulse 78   Ht 6\' 4"  (1.93 m)   Wt 194 lb 2 oz (88.1 kg)   SpO2 99%   BMI 23.63 kg/m  Wt Readings from Last 3 Encounters:  07/18/18 194 lb 2 oz (88.1 kg)  05/19/18 215 lb (97.5 kg)  03/13/18 204 lb (92.5 kg)   BP Readings from Last 3 Encounters:  07/18/18 128/80  05/20/18 (!) 109/57  03/13/18 117/74   Guideline developer:  UpToDate (see UpToDate for funding source) Date Released: June 2014 There are no preventive care reminders to display for this patient.  There are no preventive care reminders to display for this patient.  No results found for: TSH Lab Results  Component Value Date   WBC 5.8 05/19/2018   HGB 12.3 (L) 05/19/2018   HCT 37.3 (L) 05/19/2018   MCV 88.4 05/19/2018   PLT 221 05/19/2018   Lab Results  Component Value Date   NA 138 05/19/2018   K 3.6 05/19/2018   CO2 26 05/19/2018   GLUCOSE 96 05/19/2018   BUN 8 05/19/2018   CREATININE 0.66 05/19/2018   BILITOT 0.7 05/19/2018   ALKPHOS 78 05/19/2018   AST 14 (L) 05/19/2018   ALT 11 05/19/2018   PROT 6.7 05/19/2018   ALBUMIN 3.6 05/19/2018   CALCIUM  8.7 (L) 05/19/2018   ANIONGAP 8 05/19/2018   No results found for: CHOL No results found for: HDL No results found for: LDLCALC No results  found for: TRIG No results found for: CHOLHDL No results found for: [id number]    Assessment & Plan:   Problem List Items Addressed This Visit    None    Visit Diagnoses    Lumbago due to displacement of intervertebral disc    -  Primary   Relevant Medications   gabapentin (NEURONTIN) 100 MG capsule   Other Relevant Orders   Ambulatory referral to Sports Medicine      Meds ordered this encounter  Medications  . gabapentin (NEURONTIN) 100 MG capsule    Sig: Take 2 at night before bed for one week and then take 2 tablets twice daily.    Dispense:  120 capsule    Refill:  0    Follow-up: No follow-ups on file.

## 2018-07-18 NOTE — Patient Instructions (Signed)
Herniated Disk  A herniated disk, also called a ruptured disk or slipped disk, occurs when a disk in the spine bulges out too far. Between the bones in the spine (vertebrae), there are oval disks that are made of a soft, spongy center that is surrounded by a tough outer ring. The disks connect your vertebrae, help your spine move, and absorb shocks from your movement. When you have a herniated disk, the spongy center of the disk bulges out or breaks through the outer ring. It can press on a nerve between the vertebrae and cause pain. This can occur anywhere in the back or neck area, but the lower back is most commonly affected. What are the causes? This condition may be caused by:  Age-related wear and tear. The spongy centers of spinal disks tend to shrink and dry out with age, which makes them more likely to herniate.  Sudden injury, such as a strain or sprain. What increases the risk? Aging is the main risk factor for a herniated disk. Other risk factors include:  Being a man who is 30-50 years old.  Frequently doing activities that involve heavy lifting, bending, or twisting.  Frequently driving for long hours at a time.  Not getting enough exercise.  Being overweight.  Smoking.  Having a family history of back problems or herniated disks.  Being pregnant or giving birth.  Having poor nutrition.  Being tall. What are the signs or symptoms? Symptoms may vary depending on where your herniated disk is located.  A herniated disk in the lower back may cause sharp pain in: ? Part of the arm, leg, hip, or buttocks. ? The back of the lower leg (calf). ? The lower back, spreading down through the leg into the foot (sciatica).  A herniated disk in the neck may cause dizziness and vertigo. It may also cause pain or weakness in: ? The neck. ? The shoulder blades. ? Upper arm, forearm, or fingers.  You may also have muscle weakness. It may be difficult to: ? Lift your leg or arm.  ? Stand on your toes. ? Squeeze tightly with one of your hands.  Other symptoms may include: ? Numbness or tingling in the affected areas of the hands, arms, feet, or legs. ? Inability to control when you urinate or when you have bowel movements. This is a rare but serious sign of a severe herniated disk in the lower back. How is this diagnosed? This condition may be diagnosed based on:  Your symptoms.  Your medical history.  A physical exam. The exam may include: ? Straight-leg test. You will lie on your back while your health care provider lifts your leg, keeping your knee straight. If you feel pain, you likely have a herniated disk. ? Neurological tests. This includes checking for numbness, reflexes, muscle strength, and posture.  Imaging tests, such as: ? X-rays. ? MRI. ? CT scan. ? Electromyogram (EMG) to check the nerves that control muscles. This test may be used to determine which nerves are affected by your herniated disk. How is this treated? Treatment for this condition may include:  A short period of rest. This is usually the first treatment. ? You may be on bed rest for up to 2 days, or you may be instructed to stay home and avoid physical activity. ? If you have a herniated disk in your lower back, avoid sitting as much as possible. Sitting increases pressure on the disk.  Medicines. These may include: ? NSAIDs   to help reduce pain and swelling. ? Muscle relaxants to prevent sudden tightening of the back muscles (back spasms). ? Prescription pain medicines, if you have severe pain.  Steroid injections in the area of the herniated disk. This can help reduce pain and swelling.  Physical therapy to strengthen your back muscles. In many cases, symptoms go away with treatment over a period of days or weeks. You will most likely be free of symptoms after 3-4 months. If other treatments do not help to relieve your symptoms, you may need surgery. Follow these instructions  at home: Medicines  Take over-the-counter and prescription medicines only as told by your health care provider.  Do not drive or use heavy machinery while taking prescription pain medicine. Activity  Rest as directed.  After your rest period: ? Return to your normal activities and gradually begin exercising as told by your health care provider. Ask your health care provider what activities and exercises are safe for you. ? Use good posture. ? Avoid movements that cause pain. ? Do not lift anything that is heavier than 10 lb (4.5 kg) until your health care provider says this is safe. ? Do not sit or stand for long periods of time without changing positions. ? Do not sit for long periods of time without getting up and moving around.  If physical therapy was prescribed, do exercises as instructed.  Aim to strengthen muscles in your back and abdomen with exercises like crunches, swimming, or walking. General instructions  Do not use any products that contain nicotine or tobacco, such as cigarettes and e-cigarettes. These products can delay healing. If you need help quitting, ask your health care provider.  Do not wear high-heeled shoes.  Do not sleep on your belly.  If you are overweight, work with your health care provider to lose weight safely.  To prevent or treat constipation while you are taking prescription pain medicine, your health care provider may recommend that you: ? Drink enough fluid to keep your urine clear or pale yellow. ? Take over-the-counter or prescription medicines. ? Eat foods that are high in fiber, such as fresh fruits and vegetables, whole grains, and beans. ? Limit foods that are high in fat and processed sugars, such as fried and sweet foods.  Keep all follow-up visits as told by your health care provider. This is important. How is this prevented?   Maintain a healthy weight.  Try to avoid stressful situations.  Maintain physical fitness. Do at  least 150 minutes of moderate-intensity exercise each week, such as brisk walking or water aerobics.  When lifting objects: ? Keep your feet at least shoulder-width apart and tighten your abdominal muscles. ? Keep your spine neutral as you bend your knees and hips. It is important to lift using the strength of your legs, not your back. Do not lock your knees straight out. ? Always ask for help to lift heavy or awkward objects. Contact a health care provider if:  You have back pain or neck pain that does not get better after 6 weeks.  You have severe pain in your back, neck, legs, or arms.  You develop numbness, tingling, or weakness in any part of your body. Get help right away if:  You cannot move your arms or legs.  You cannot control when you urinate or have bowel movements.  You feel dizzy or you faint.  You have shortness of breath. This information is not intended to replace advice given to you by   your health care provider. Make sure you discuss any questions you have with your health care provider. Document Released: 12/26/1999 Document Revised: 12/10/2016 Document Reviewed: 08/25/2015 Elsevier Patient Education  2020 [employer].  

## 2018-07-25 LAB — HEPATITIS C RNA QUANTITATIVE
HCV Quantitative Log: <1.18 Log IU/mL
HCV RNA, PCR, QN: <15 IU/mL

## 2018-07-25 NOTE — Progress Notes (Signed)
SVR12 with undetectable RNA indicating cure of his infection. Will discuss at upcoming appointment.

## 2018-07-27 ENCOUNTER — Encounter: Payer: Self-pay | Admitting: Family Medicine

## 2018-07-27 ENCOUNTER — Other Ambulatory Visit: Payer: Self-pay

## 2018-07-27 ENCOUNTER — Ambulatory Visit: Payer: BC Managed Care – PPO | Admitting: Family Medicine

## 2018-07-27 DIAGNOSIS — M545 Low back pain: Secondary | ICD-10-CM | POA: Diagnosis not present

## 2018-07-27 DIAGNOSIS — G8929 Other chronic pain: Secondary | ICD-10-CM

## 2018-07-27 NOTE — Progress Notes (Signed)
Samuel Andrade - 38 y.o. male MRN [id number]  Date of birth: 02/21/1980  SUBJECTIVE:  Including CC & ROS.  Chief Complaint  Patient presents with  . Back Pain    mid to right-sided low back    Samuel AbtsJohn E Andrade is a 38 y.o. male that is presenting with acute on chronic low back pain.  The pain seems to be midline into the right.  He denies any significant radicular symptoms today.  He has a history of discectomy in 2003.  The pain is improved somewhat since May.  He still has some lingering pain.  The pain is worse with certain movements.  It can range from mild to severe.  Pain can be sharp and stabbing..  Independent review of the thoracic spine MRI from 5/9 shows no acute cord abnormality and discogenic degenerative changes of T4-T0.  Independent review of the lumbar spine MRI from 5/9 shows L4-5 disc protrusion.   Review of Systems  Constitutional: Negative for fever.  HENT: Negative for congestion.   Respiratory: Negative for cough.   Cardiovascular: Negative for chest pain.  Gastrointestinal: Negative for abdominal pain.  Musculoskeletal: Positive for back pain.  Skin: Negative for color change.  Neurological: Negative for weakness.  Hematological: Negative for adenopathy.    HISTORY: Past Medical, Surgical, Social, and Family History Reviewed & Updated per EMR.   Pertinent Historical Findings include:  Past Medical History:  Diagnosis Date  . Anxiety   . Depression   . Neuromuscular disorder (HCC)   . Opioid abuse (HCC)   . Opioid overdose (HCC)   . Substance abuse [facility](HCC)     Past Surgical History:  Procedure Laterality Date  . FRACTURE SURGERY    . HERNIA REPAIR    . SPINE SURGERY      No Known Allergies  Family History  Problem Relation Age of Onset  . Hypertension Father   . Healthy Mother   . Stroke Maternal Grandmother   . Mental illness Maternal Grandmother   . Stroke Maternal Grandfather   . Heart disease Maternal Grandfather   . Stroke Paternal  Grandmother   . Diabetes Paternal Grandfather      Social History   Socioeconomic History  . Marital status: Single    Spouse name: Not on file  . Number of children: Not on file  . Years of education: Not on file  . Highest education level: Not on file  Occupational History  . Not on file  Social Needs  . Financial resource strain: Not on file  . Food insecurity    Worry: Not on file    Inability: Not on file  . Transportation needs    Medical: Not on file    Non-medical: Not on file  Tobacco Use  . Smoking status: Current Every Day Smoker    Packs/day: 0.50    Years: 9.00    Pack years: 4.50    Types: Cigarettes  . Smokeless tobacco: Never Used  Substance and Sexual Activity  . Alcohol use: Yes    Comment: once a week  . Drug use: Yes    Types: IV    Comment: heroin, Xanax  . Sexual activity: Not Currently  Lifestyle  . Physical activity    Days per week: Not on file    Minutes per session: Not on file  . Stress: Not on file  Relationships  . Social Musicianconnections    Talks on phone: Not on file    Gets together: Not  on file    Attends religious service: Not on file    Active member of club or organization: Not on file    Attends meetings of clubs or organizations: Not on file    Relationship status: Not on file  . Intimate partner violence    Fear of current or ex partner: Not on file    Emotionally abused: Not on file    Physically abused: Not on file    Forced sexual activity: Not on file  Other Topics Concern  . Not on file  Social History Narrative  . Not on file     PHYSICAL EXAM:  VS: BP 119/74   Pulse 81   Ht 6\' 3"  (1.905 m)   Wt 195 lb (88.5 kg)   BMI 24.37 kg/m  Physical Exam Gen: NAD, alert, cooperative with exam, well-appearing ENT: normal lips, normal nasal mucosa,  Eye: normal EOM, normal conjunctiva and lids CV:  no edema, +2 pedal pulses   Resp: no accessory muscle use, non-labored,   Skin: no rashes, no areas of induration   Neuro: normal tone, normal sensation to touch Psych:  normal insight, alert and oriented MSK:  Back: Some tenderness to palpation of the right paraspinal muscle. Normal flexion extension. Normal internal and external rotation of the hips. Normal strength resistance with hip flexion, knee flexion extension, plantarflexion and dorsiflexion. No tenderness to palpation over the SI joint or greater trochanter. +2 patellar deep tendon reflexes bilaterally. Negative straight leg raise bilaterally. Neurovascular intact     ASSESSMENT & PLAN:   Low back pain Symptoms seem to be more spasm related.  He has some degenerative changes upon the MRI of the lower lumbar segment and no symptoms today of radiculopathy.  Has a history of discectomy in 2003.  He has seen the neurosurgeon Dr. Vertell Limber and he did not warrant any further treatment at this time -Counseled on chiropractor. -Counseled on home exercise therapy and supportive care. -If no improvement could consider physical therapy, trigger point injections or facet injections.

## 2018-07-27 NOTE — Patient Instructions (Signed)
Nice to meet you Please try heat on the lower back  Please try the exercises  You can try a chiropractor   Please send me a message in [place] with any questions or updates.  Please see me back in 4 weeks.   --Dr. Raeford Razor

## 2018-07-27 NOTE — Assessment & Plan Note (Addendum)
Symptoms seem to be more spasm related.  He has some degenerative changes upon the MRI of the lower lumbar segment and no symptoms today of radiculopathy.  Has a history of discectomy in 2003.  He has seen the neurosurgeon Dr. Vertell Limber and he did not warrant any further treatment at this time -Counseled on chiropractor. -Counseled on home exercise therapy and supportive care. -If no improvement could consider physical therapy, trigger point injections or facet injections.

## 2018-08-01 ENCOUNTER — Other Ambulatory Visit: Payer: Self-pay

## 2018-08-01 ENCOUNTER — Telehealth: Payer: Self-pay | Admitting: Pharmacy Technician

## 2018-08-01 ENCOUNTER — Ambulatory Visit (INDEPENDENT_AMBULATORY_CARE_PROVIDER_SITE_OTHER): Payer: BC Managed Care – PPO | Admitting: Infectious Diseases

## 2018-08-01 DIAGNOSIS — Z8619 Personal history of other infectious and parasitic diseases: Secondary | ICD-10-CM

## 2018-08-01 DIAGNOSIS — F1911 Other psychoactive substance abuse, in remission: Secondary | ICD-10-CM

## 2018-08-01 NOTE — Progress Notes (Signed)
Patient Name: Samuel AbtsJohn E Andrade  Date of Birth: December 22, 1980  MRN: [id number]  PCP: Mliss SaxKremer, William Alfred, MD  Referring Provider: Mliss SaxKremer, William Alfred, MD    HPI/ROS:  Samuel AbtsJohn E Andrade is a 38 y.o. male. Treatment for chronic hepatitis C infection, genotype 3, F0, previously treated in the past (re-infection with relapsing injection drug use).   Completed Mavyret x 8 weeks - unable to tell me when exactly he completed nor when he started but had a lapse of about a week during treatment. He has continued to do online support meetings with NA and is living in controlled environment to help with his ongoing sobriety and abstainance from injection drugs or other illicit substances. He is feeling well and has no concerns today aside from what his labs look like and if his hepatitis c has been cured.   No changes to his health history, periods of significant illness or hospitalizations/ER visits.   ROS:  Constitutional: negative for fevers, fatigue and malaise Eyes: negative for icterus Cardiovascular: negative for chest pain, orthopnea, lower extremity edema Gastrointestinal: negative for change in bowel habits, abdominal pain and jaundice Integument/breast: negative for rash and pruritus Hematologic/lymphatic: negative for easy bruising and petechiae Musculoskeletal: negative for arthralgias Neurological: negative for coordination problems and gait problems All other systems reviewed and are negative      Past Medical History:  Diagnosis Date  . Anxiety   . Depression   . Neuromuscular disorder (HCC)   . Opioid abuse (HCC)   . Opioid overdose (HCC)   . Substance abuse (HCC)    Outpatient Medications Prior to Visit  Medication Sig Dispense Refill  . gabapentin (NEURONTIN) 100 MG capsule Take 2 at night before bed. 60 capsule 1   No facility-administered medications prior to visit.     No Known Allergies  Social History   Tobacco Use  . Smoking status: Current Every Day  Smoker    Packs/day: 0.50    Years: 9.00    Pack years: 4.50    Types: Cigarettes  . Smokeless tobacco: Never Used  Substance Use Topics  . Alcohol use: Yes    Comment: once a week  . Drug use: Yes    Types: IV    Comment: heroin, Xanax     Objective:   Seated comfortably in chair. No distress.   Laboratory: Genotype:  Lab Results  Component Value Date   HCVGENOTYPE 3 11/28/2017   HCV viral load:  Lab Results  Component Value Date   HCVQUANT 3,086,5786,076,591 (H) 07/26/2014   Hep C Levels:  01/24/2018: < 15 copies 03/13/18: < 15 copies  07/18/18 (SVR12): < 15 copies   Lab Results  Component Value Date   ALT 11 05/19/2018   AST 14 (L) 05/19/2018   ALKPHOS 78 05/19/2018   BILITOT 0.7 05/19/2018     Assessment & Plan:   Problem List Items Addressed This Visit      Unprioritized   Hepatitis C virus infection cured after antiviral drug therapy    Treated with Mavyret x 8 weeks for Genotype 3. Pre-treatment fibrosis assessment revealed no concern for fibrosis with Metavir F0 score. We discussed that his antibody will always be positive but does not mean he has had a return of active infection. Should he resume high risk behaviors he will be at risk to again contract hepatitis c again in the future. He understands this.   No further follow up needed for liver care going forward outside of limiting other  hepatotoxins, maintaining healthy weight and adequate diet to prevent metabolic syndrome.   Continues to decline vaccines.   He can return to clinic PRN.       History of substance abuse ([place])    Negative UDS in May 2020. Continues to report adequate support at home and ongoing sobriety from illicit substances. I congratulated him on his efforts and hope that he continues to do well.          I spent 15 minutes with the patient discussing his lab results, interpretation, liver health, risk for reinfection, harm reduction strategies, and further care.    Janene Madeira, MSN, NP-C [facility] [email or web address] Pager: [phone] Office: [phone]

## 2018-08-01 NOTE — Patient Instructions (Addendum)
Your hepatitis C infection has been cured!  Your liver function tests have normalized - and there is no evidence of permanent damage.   Would continue to keep your liver inflammation at a minimum by decreasing as much as possible alcohol intake, keeping a healthy weight and continuing to avoid getting reinfected with Hepatitis C again.   You do not need any further follow up with our office at this time.   You will always have a positive antibody (memory) of hepatitis C infection in the blood so don't let that scare you. This does not mean you have active infection by any means. The only reason you would need further testing is if you are concerned about re-infection.

## 2018-08-01 NOTE — Telephone Encounter (Signed)
[facility] Patient Advocate Encounter  Insurance verification completed.    The patient is insured through [employer].  We will continue to follow to see if copay assistance is needed.  Krishav Mamone E. Mikele Sifuentes, CPhT [facility] Phone: [phone] Fax:  [phone]   

## 2018-08-02 ENCOUNTER — Encounter: Payer: Self-pay | Admitting: Infectious Diseases

## 2018-08-02 NOTE — Assessment & Plan Note (Signed)
Treated with Mavyret x 8 weeks for Genotype 3. Pre-treatment fibrosis assessment revealed no concern for fibrosis with Metavir F0 score. We discussed that his antibody will always be positive but does not mean he has had a return of active infection. Should he resume high risk behaviors he will be at risk to again contract hepatitis c again in the future. He understands this.   No further follow up needed for liver care going forward outside of limiting other hepatotoxins, maintaining healthy weight and adequate diet to prevent metabolic syndrome.   Continues to decline vaccines.   He can return to clinic PRN.

## 2018-08-02 NOTE — Assessment & Plan Note (Signed)
Negative UDS in May 2020. Continues to report adequate support at home and ongoing sobriety from illicit substances. I congratulated him on his efforts and hope that he continues to do well.

## 2018-08-07 IMAGING — MR MR ABDOMEN WO/W CM
12 of 17 series · 27 of 48 positions shown · IV contrast (20ml Multihance)
Comparison: CT scan 03/02/2016.

CLINICAL DATA: 2 cm cystic lesion interpolar right kidney with
possible enhancement on previous CT scan.

EXAM:
MRI ABDOMEN WITHOUT AND WITH CONTRAST
TECHNIQUE: Multiplanar multisequence MR imaging of the abdomen was performed
both before and after the administration of intravenous contrast.
CONTRAST:  20mL MULTIHANCE GADOBENATE DIMEGLUMINE 529 MG/ML IV SOLN

[Series 3: T2 · coronal · 5.0mm · 1.56mm/px · 1 of 32 slices shown (1 of 3)]
[im 1/32]
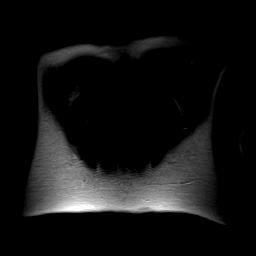

[Series 4: axial tru fisp · axial · 4.0mm · 1.41mm/px · 1 of 42 slices shown]
[im 1/42]
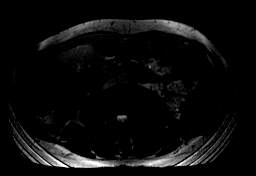

[Series 5: [id number] · axial · 6.0mm · 1.98mm/px · z∈[-90,+151]mm · 3 of 96 slices shown]
[im 1/96]
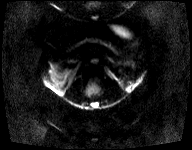
[im 48/96]
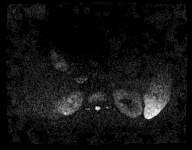
[im 96/96]
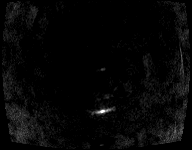

[Series 6: [id number]_adc · axial · 6.0mm · 1.98mm/px · 1 of 32 slices shown]
[im 1/32]
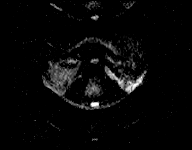

[Series 7: T2 · axial · 6.5mm · 0.74mm/px · 1 of 31 slices shown (2 of 3)]
[im 1/31]
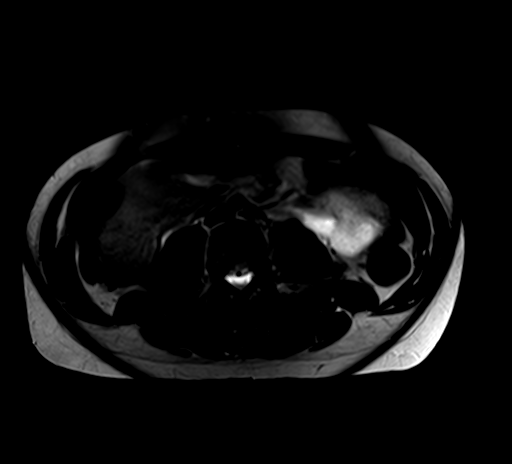

[Series 8: T2 · axial · 5.0mm · 1.41mm/px · 1 of 35 slices shown (3 of 3)]
[im 1/35]
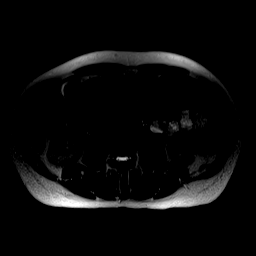

[Series 9: axial in out · axial · 5.5mm · 0.70mm/px · z∈[-78,+130]mm · 2 of 68 slices shown]
[im 1/68]
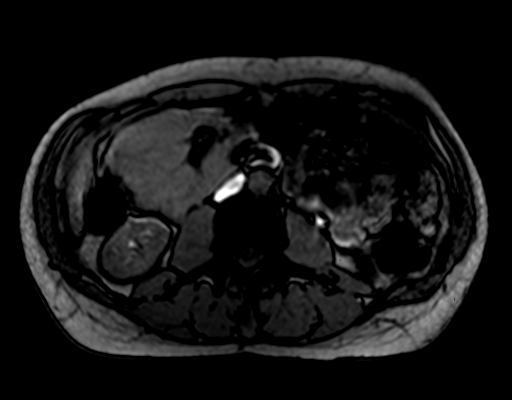
[im 68/68]
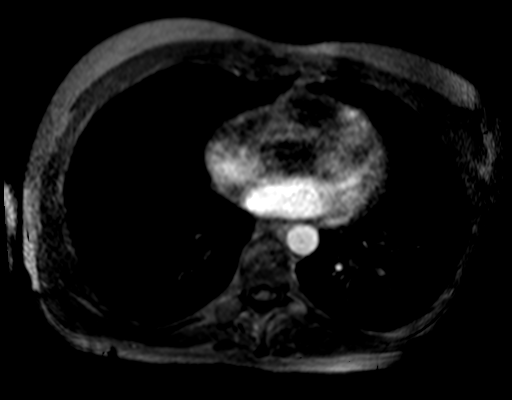

[Series 10: T1 dynamic · axial · non-contrast · 2.5mm · 0.70mm/px · z∈[-82,+136]mm · 3 of 88 slices shown]
[im 1/88]
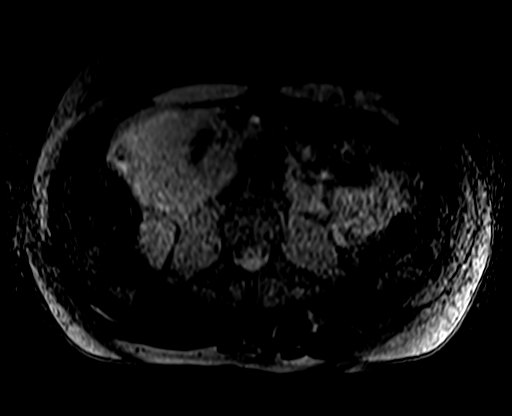
[im 44/88]
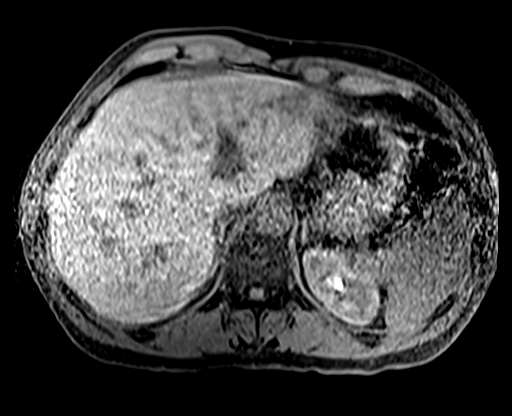
[im 88/88]
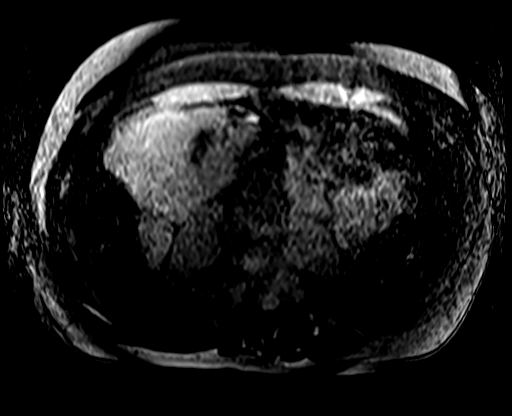

[Series 11: post 25 sec · axial · 2.5mm · 0.70mm/px · z∈[-82,+136]mm · 4 of 88 slices shown]
[im 1/88]
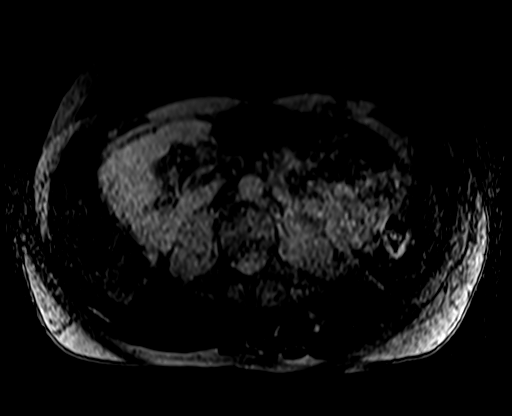
[im 30/88]
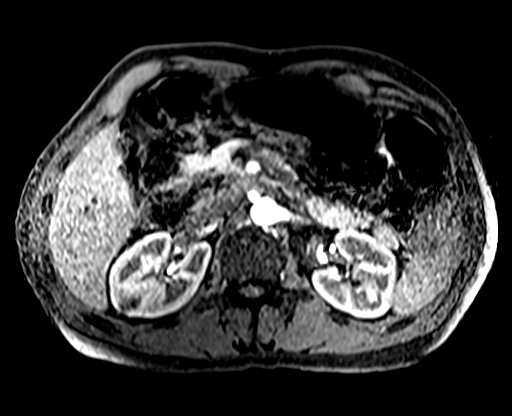
[im 59/88]
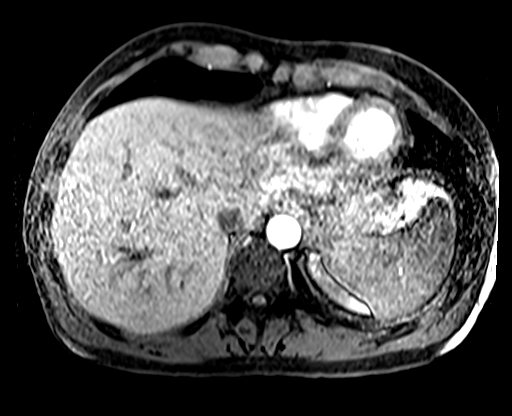
[im 88/88]
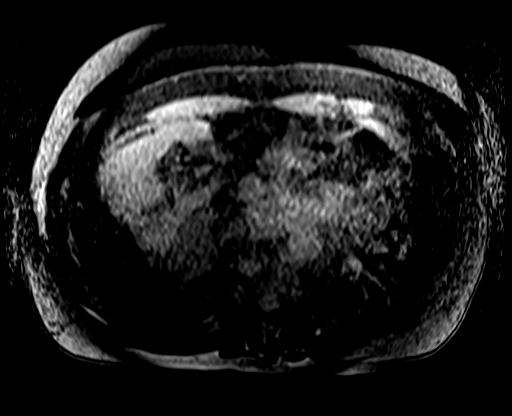

[Series 12: post 25 sec_sub · axial · 2.5mm · 0.70mm/px · z∈[-82,+136]mm · 4 of 88 slices shown]
[im 1/88]
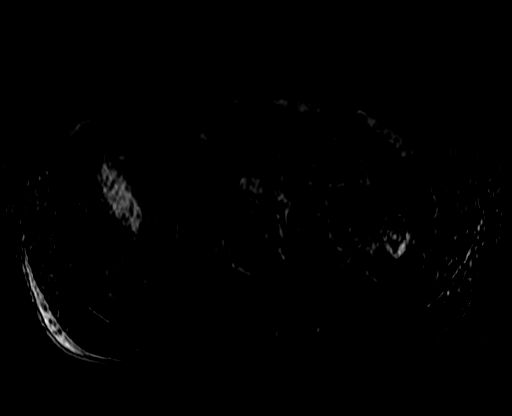
[im 30/88]
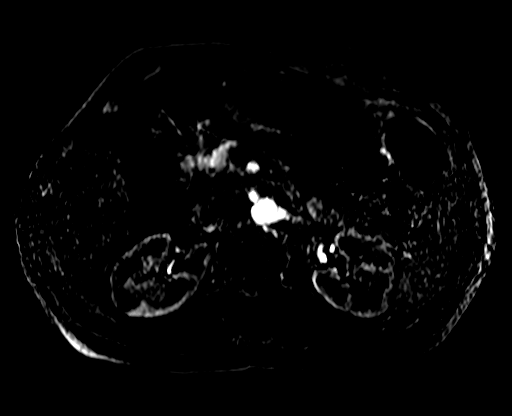
[im 59/88]
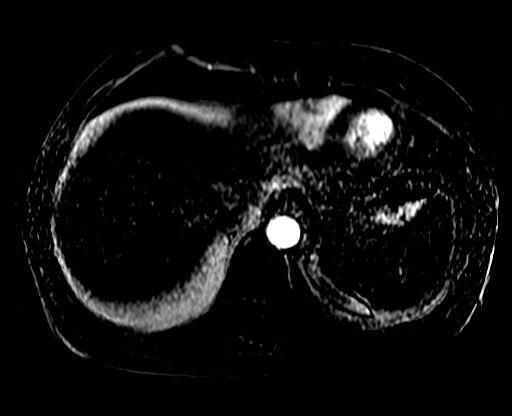
[im 88/88]
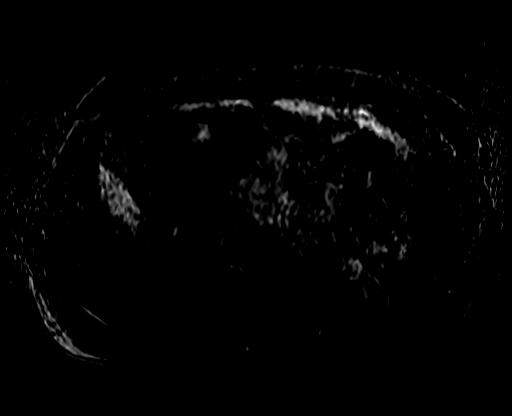

[Series 13: post 45 sec · axial · 2.5mm · 0.70mm/px · z∈[-82,+136]mm · 4 of 88 slices shown]
[im 1/88]
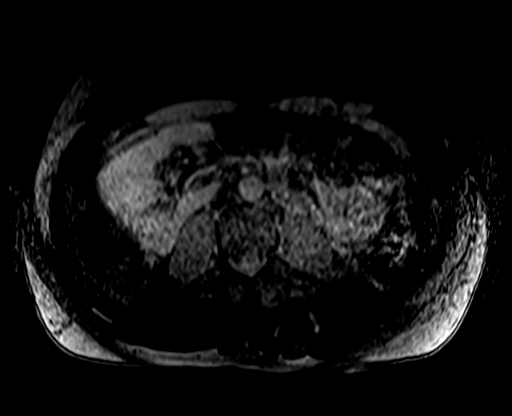
[im 30/88]
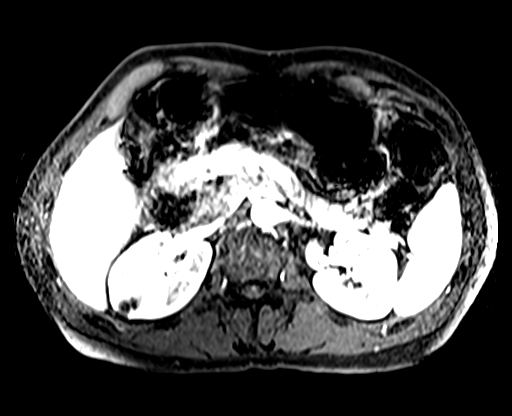
[im 59/88]
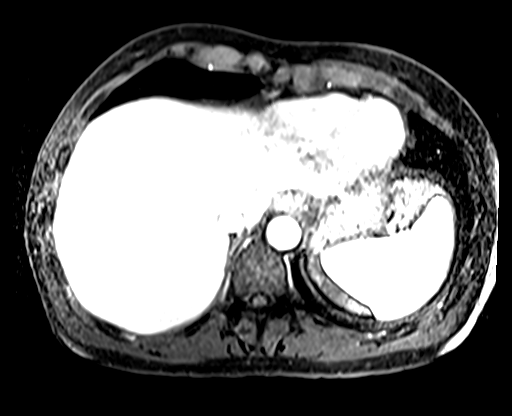
[im 88/88]
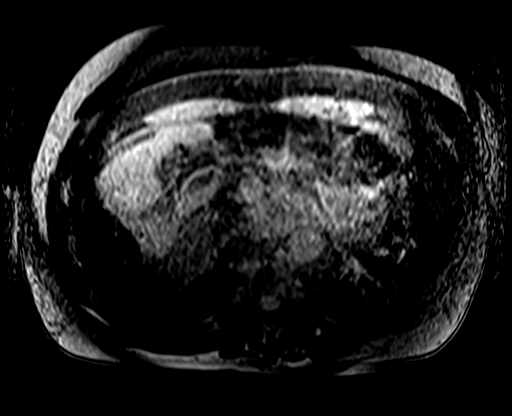

[Series 14: post 45 sec_sub · axial · 2.5mm · 0.70mm/px · z∈[-82,-9]mm · 2 of 88 slices shown]
[im 1/88]
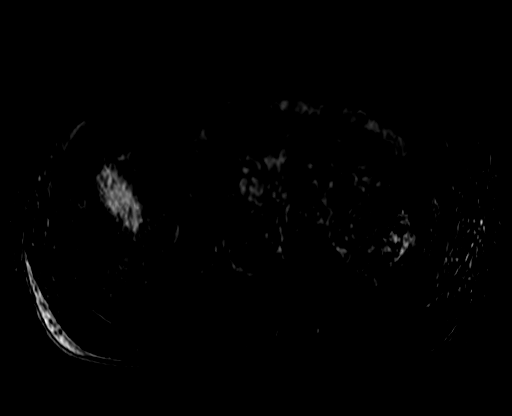
[im 30/88]
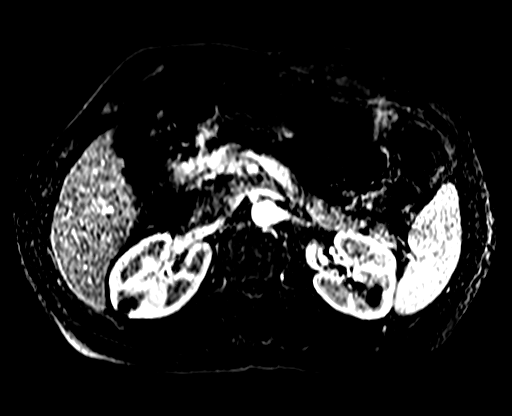

[27 of 48 positions shown; findings below may reference images not displayed]

FINDINGS: Lower chest:  Unremarkable.

Hepatobiliary: Liver is unremarkable. There is no evidence for
gallstones, gallbladder wall thickening, or pericholecystic fluid.
No intrahepatic or extrahepatic biliary dilation.

Pancreas: No focal mass lesion. No dilatation of the main duct. No
intraparenchymal cyst. No peripancreatic edema.

Spleen: No splenomegaly. No focal mass lesion.

Adrenals/Urinary Tract: No adrenal nodule or mass.

1.5 cm well-defined homogeneous lesion identified in the lateral
aspect of the interpolar left kidney corresponds to the 2 cm lesion
seen on previous CT scan. By MRI, this lesion is seen to contain
internal proteinaceous debris or hemorrhage, but again demonstrates
no enhancement after IV contrast administration. Given the presence
of this internal proteinaceous content, the lesion is now
characterized as Bosniak II.

The lesion in question is identified in the posterior aspect of the
interpolar right kidney. This lesion measures 1.8 cm on MRI today.
It is T1 hypointense and T2 hyperintense with a single thin internal
septation identified posteriorly. After IV contrast administration
no enhancement is evident within the lesion. Imaging features are
consistent with Bosniak II cyst.

7 mm lesion identified more anteriorly in the interpolar right
kidney represents the lesion seen on previous CT. By MRI today, this
lesion can be characterized as a simple (Bosniak I) cyst.

Stomach/Bowel: Stomach is nondistended. No gastric wall thickening.
No evidence of outlet obstruction. Duodenum is normally positioned
as is the ligament of Treitz. No small bowel or colonic dilatation
within the visualized abdomen.

Vascular/Lymphatic: No abdominal aortic aneurysm. No evidence for
abdominal lymphadenopathy.

Other: No intraperitoneal free fluid.

Musculoskeletal: No abnormal marrow signal within the visualized
bony anatomy.
IMPRESSION: 1. The bilateral nonobstructing renal stones seen on previous CT
scan are not evident by MRI.
[DATE] cm well-defined lesion in the interpolar left kidney
corresponds to the 2 cm lesion identified on previous CT. MR imaging
features today are consistent with Bosniak II cyst.
3. 1.8 cm lesion in the posterior aspect of the interpolar right
kidney corresponds to the lesion in question on previous CT scan.
Single thin internal septation identified within this cyst
consistent with Bosniak II category.
4. 7 mm Bosniak I cyst anterior aspect interpolar right kidney.

## 2018-08-24 ENCOUNTER — Ambulatory Visit: Payer: BC Managed Care – PPO | Admitting: Family Medicine

## 2018-11-02 ENCOUNTER — Telehealth: Payer: Self-pay | Admitting: Family Medicine

## 2018-11-02 NOTE — Telephone Encounter (Signed)
Okay for pt to get Hep A vaccine?

## 2018-11-02 NOTE — Telephone Encounter (Signed)
Okay to schedule pt for Hep A vaccine.

## 2018-11-02 NOTE — Telephone Encounter (Signed)
Patient's mother is calling to request if office can call the patient regarding Hep A vaccine. It does not not matter if he had it all ready. He is required to get a new one. Immuno goblinun type G because the father has been diagnosed with hep A. And every one in the family needs to have the above vaccines.  Patient CB- [phone]

## 2018-11-02 NOTE — Telephone Encounter (Signed)
Done

## 2018-11-02 NOTE — Telephone Encounter (Signed)
Okay 

## 2018-11-06 ENCOUNTER — Other Ambulatory Visit: Payer: Self-pay | Admitting: *Deleted

## 2018-11-06 DIAGNOSIS — Z20822 Contact with and (suspected) exposure to covid-19: Secondary | ICD-10-CM

## 2018-11-07 ENCOUNTER — Ambulatory Visit (INDEPENDENT_AMBULATORY_CARE_PROVIDER_SITE_OTHER): Payer: BC Managed Care – PPO

## 2018-11-07 ENCOUNTER — Other Ambulatory Visit: Payer: Self-pay

## 2018-11-07 DIAGNOSIS — Z23 Encounter for immunization: Secondary | ICD-10-CM | POA: Diagnosis not present

## 2018-11-07 LAB — NOVEL CORONAVIRUS, NAA: SARS-CoV-2, NAA: NOT DETECTED

## 2018-11-07 NOTE — Progress Notes (Signed)
Patient came into clinic today for a hep A vaccine. Injection given in right deltoid. Patient tolerated well. Next injection scheduled May 08 2019.

## 2018-11-17 NOTE — Progress Notes (Signed)
This encounter was created in error - please disregard.

## 2018-11-29 ENCOUNTER — Other Ambulatory Visit: Payer: Self-pay

## 2018-11-29 DIAGNOSIS — Z20822 Contact with and (suspected) exposure to covid-19: Secondary | ICD-10-CM

## 2018-12-01 LAB — NOVEL CORONAVIRUS, NAA: SARS-CoV-2, NAA: NOT DETECTED

## 2018-12-04 DIAGNOSIS — Z20828 Contact with and (suspected) exposure to other viral communicable diseases: Secondary | ICD-10-CM | POA: Diagnosis not present

## 2018-12-20 DIAGNOSIS — Z20828 Contact with and (suspected) exposure to other viral communicable diseases: Secondary | ICD-10-CM | POA: Diagnosis not present

## 2019-01-29 DIAGNOSIS — Z20828 Contact with and (suspected) exposure to other viral communicable diseases: Secondary | ICD-10-CM | POA: Diagnosis not present

## 2019-05-07 ENCOUNTER — Other Ambulatory Visit: Payer: Self-pay

## 2019-05-08 ENCOUNTER — Ambulatory Visit: Payer: BC Managed Care – PPO

## 2019-05-28 ENCOUNTER — Other Ambulatory Visit: Payer: Self-pay

## 2019-05-28 ENCOUNTER — Ambulatory Visit: Payer: BC Managed Care – PPO | Admitting: Family Medicine

## 2019-05-29 ENCOUNTER — Ambulatory Visit (INDEPENDENT_AMBULATORY_CARE_PROVIDER_SITE_OTHER): Payer: BC Managed Care – PPO

## 2019-05-29 DIAGNOSIS — Z23 Encounter for immunization: Secondary | ICD-10-CM | POA: Diagnosis not present

## 2019-05-29 NOTE — Progress Notes (Signed)
After obtaining consent, and per orders of Dr. Doreene Burke, injection of Hep-A given in right deltoid by Rayetta Veith Jule Ser. Patient instructed to remain in clinic for 20 minutes afterwards, and to report any adverse reaction to me immediately.

## 2019-06-26 DIAGNOSIS — Z79899 Other long term (current) drug therapy: Secondary | ICD-10-CM | POA: Diagnosis not present

## 2019-06-26 DIAGNOSIS — F17210 Nicotine dependence, cigarettes, uncomplicated: Secondary | ICD-10-CM | POA: Diagnosis not present

## 2019-07-10 DIAGNOSIS — Z79899 Other long term (current) drug therapy: Secondary | ICD-10-CM | POA: Diagnosis not present

## 2019-07-24 DIAGNOSIS — Z79899 Other long term (current) drug therapy: Secondary | ICD-10-CM | POA: Diagnosis not present

## 2019-10-31 DIAGNOSIS — Z23 Encounter for immunization: Secondary | ICD-10-CM | POA: Diagnosis not present

## 2019-11-13 DIAGNOSIS — F1120 Opioid dependence, uncomplicated: Secondary | ICD-10-CM | POA: Diagnosis not present

## 2019-11-13 DIAGNOSIS — F1510 Other stimulant abuse, uncomplicated: Secondary | ICD-10-CM | POA: Diagnosis not present

## 2020-12-17 DIAGNOSIS — R8290 Unspecified abnormal findings in urine: Secondary | ICD-10-CM | POA: Diagnosis not present

## 2020-12-17 DIAGNOSIS — Z1339 Encounter for screening examination for other mental health and behavioral disorders: Secondary | ICD-10-CM | POA: Diagnosis not present

## 2020-12-17 DIAGNOSIS — R5383 Other fatigue: Secondary | ICD-10-CM | POA: Diagnosis not present

## 2020-12-17 DIAGNOSIS — Z0000 Encounter for general adult medical examination without abnormal findings: Secondary | ICD-10-CM | POA: Diagnosis not present

## 2020-12-17 DIAGNOSIS — Z6832 Body mass index (BMI) 32.0-32.9, adult: Secondary | ICD-10-CM | POA: Diagnosis not present

## 2020-12-17 DIAGNOSIS — R0602 Shortness of breath: Secondary | ICD-10-CM | POA: Diagnosis not present

## 2020-12-17 DIAGNOSIS — Z1331 Encounter for screening for depression: Secondary | ICD-10-CM | POA: Diagnosis not present

## 2020-12-17 DIAGNOSIS — Z1159 Encounter for screening for other viral diseases: Secondary | ICD-10-CM | POA: Diagnosis not present

## 2020-12-17 DIAGNOSIS — Z131 Encounter for screening for diabetes mellitus: Secondary | ICD-10-CM | POA: Diagnosis not present

## 2020-12-17 DIAGNOSIS — F17210 Nicotine dependence, cigarettes, uncomplicated: Secondary | ICD-10-CM | POA: Diagnosis not present

## 2020-12-17 DIAGNOSIS — Z125 Encounter for screening for malignant neoplasm of prostate: Secondary | ICD-10-CM | POA: Diagnosis not present

## 2020-12-17 DIAGNOSIS — R600 Localized edema: Secondary | ICD-10-CM | POA: Diagnosis not present

## 2020-12-17 DIAGNOSIS — E559 Vitamin D deficiency, unspecified: Secondary | ICD-10-CM | POA: Diagnosis not present

## 2020-12-17 DIAGNOSIS — Z114 Encounter for screening for human immunodeficiency virus [HIV]: Secondary | ICD-10-CM | POA: Diagnosis not present

## 2020-12-17 DIAGNOSIS — Z13220 Encounter for screening for lipoid disorders: Secondary | ICD-10-CM | POA: Diagnosis not present

## 2020-12-28 ENCOUNTER — Encounter (HOSPITAL_COMMUNITY): Payer: Self-pay | Admitting: Registered Nurse

## 2020-12-28 ENCOUNTER — Ambulatory Visit (HOSPITAL_COMMUNITY)
Admission: EM | Admit: 2020-12-28 | Discharge: 2020-12-28 | Disposition: A | Discharge status: Home / Self Care | Payer: BC Managed Care – PPO

## 2020-12-28 DIAGNOSIS — F1994 Other psychoactive substance use, unspecified with psychoactive substance-induced mood disorder: Secondary | ICD-10-CM

## 2020-12-28 DIAGNOSIS — F1510 Other stimulant abuse, uncomplicated: Secondary | ICD-10-CM

## 2020-12-28 NOTE — Discharge Instructions (Signed)
Take all medications as prescribed. Keep all follow-up appointments as scheduled.  Do not consume alcohol or use illegal drugs while on prescription medications. Report any adverse effects from your medications to your primary care provider promptly.  In the event of recurrent symptoms or worsening symptoms, call 911, a crisis hotline, or go to the nearest emergency department for evaluation.   

## 2020-12-28 NOTE — ED Provider Notes (Addendum)
Behavioral Health Urgent Care Medical Screening Exam  Patient Name: Samuel Andrade MRN: [id number] Date of Evaluation: 12/28/20 Chief Complaint:   Diagnosis:  Final diagnoses:  Substance induced mood disorder ([place])  Methamphetamine abuse ([place])    History of Present illness: Samuel Andrade is a 40 y.o. male.  For [facility] urgent care accompanied by his father.  Patient was asked the reason for his admission.  He stated " me and my father do not see eye to eye on things. I do not know why I am here?"  He denies suicidal or homicidal ideations.  Denies auditory visual hallucinations.  Reports he was followed by therapy/psychologist in the past.  Denied previous inpatient admissions.  Patient presents disheveled, irritable and needs constant redirection.  Circumstantial throughout this assessment.  He denied illicit drug use or substance abuse history.  Samuel Andrade patient provided verbal authorization to follow-up with his Father Samuel Andrade.   Patient's father reports concerns with substance abuse to methamphetamines and paranoia.  States he recently picked his son up from the street corner.  He reports patient describes paranoia and people are watching him.  States my wife is afraid to allow him back in the home. Reported patient has been using and abusing drugs since the age of 40 years old.   Samuel Andrade became frustrated throughout this assessment and declined to start speaking to this provider and TTS counselor.  We will make additional outpatient resources available for substance abuse and psychiatry for follow-up.  Samuel Andrade, 40 y.o., male patient seen face to face by this provider and chart reviewed on 12/28/20.  On evaluation Samuel Andrade reports   During evaluation Samuel Andrade is sitting in no acute distress. He  is alert/oriented x 4; calm, anxious and  irritable; and mood congruent with affect.  He is speaking in a clearly but  tangential with minimal eye  contact.  His thought process is coherent   There is no indication that he/she is currently responding to internal/external stimuli or experiencing delusional thought content; and he has denied suicidal/self-harm/homicidal ideation, psychosis, and paranoia.    At this time Samuel Andrade is educated and verbalizes understanding of mental health resources and other crisis services in the community. He is instructed to call 911 and present to the nearest emergency room should he experience any suicidal/homicidal ideation, auditory/visual/hallucinations, or detrimental worsening of his mental health condition. He was a also advised by Probation officer that he could call the toll-free phone on insurance card to assist with identifying in network counselors and agencies or number on back of Medicaid card t0 speak with care coordinator    Psychiatric Specialty Exam  Presentation  General Appearance:Bizarre  Eye Contact:Minimal  Speech:Clear and Coherent  Speech Volume:Normal  Handedness:Right   Mood and Affect  Mood:Irritable  Affect:Inappropriate   Thought Process  Thought Processes:Coherent  Descriptions of Associations:Circumstantial  Orientation:Full (Time, Place and Person)  Thought Content:Logical; Paranoid Ideation  Diagnosis of Schizophrenia or Schizoaffective disorder in past: No   Hallucinations:None  Ideas of Reference:None  Suicidal Thoughts:No  Homicidal Thoughts:No   Sensorium  Memory:Immediate Fair; Recent Fair; Remote Samuel Andrade  Insight:Fair   Executive Functions  Concentration:Fair  Attention Span:Poor  [place] of Knowledge:Poor; Fair  Language:Fair   Psychomotor Activity  Psychomotor Activity:Normal   Assets  Assets:Social Support   Sleep  Sleep:Fair  Number of hours: No data recorded  Nutritional Assessment (For OBS and [facility] admissions only)  Has the patient had a weight loss or gain of 10 pounds or more in the last  3 months?: No Has the patient had a decrease in food intake/or appetite?: No Does the patient have dental problems?: No Does the patient have eating habits or behaviors that may be indicators of an eating disorder including binging or inducing vomiting?: No Has the patient recently lost weight without trying?: 0 Has the patient been eating poorly because of a decreased appetite?: 0 Malnutrition Screening Tool Score: 0    Physical Exam: Physical Exam Vitals reviewed.  Cardiovascular:     Rate and Rhythm: Normal rate.     Pulses: Normal pulses.  Neurological:     Mental Status: He is alert and oriented to person, place, and time.  Psychiatric:        Attention and Perception: He is inattentive.        Mood and Affect: Mood is anxious. Affect is labile.        Speech: Speech is delayed.        Behavior: Behavior is agitated.        Thought Content: Thought content is paranoid.   Review of Systems  Constitutional: Negative.   Cardiovascular: Negative.   Musculoskeletal: Negative.   Skin: Negative.   Psychiatric/Behavioral:  Positive for substance abuse. Negative for depression and suicidal ideas. The patient is nervous/anxious.   All other systems reviewed and are negative. Blood pressure (!) 154/98, pulse 92, temperature 98.2 F (36.8 C), temperature source Oral, resp. rate 18, SpO2 99 %. There is no height or weight on file to calculate BMI.  Musculoskeletal: Strength & Muscle Tone: within normal limits Gait & Station: normal Patient leans: N/A   [facility] MSE Discharge Disposition for Follow up and Recommendations: Based on my evaluation the patient does not appear to have an emergency medical condition and can be discharged with resources and follow up care in outpatient services for Medication Management and Substance Abuse Intensive Outpatient Program   Oneta Rack, NP 12/28/2020, 3:58 PM

## 2020-12-28 NOTE — BH Assessment (Signed)
Patient is a 40 year old male that presents this date with ongoing anxiety and family stress. Patient is denying any S/I, H/I or AVH. Patient is denying any thoughts of self harm or current SA issues. Patient has a prior history of opiate use although denies any current use. Patient denies any current

## 2021-01-07 ENCOUNTER — Telehealth (HOSPITAL_COMMUNITY): Payer: Self-pay | Admitting: Family Medicine

## 2021-01-07 NOTE — BH Assessment (Signed)
Care Management - [facility] Follow Up Discharges  ° °Writer attempted to make contact with patient today and was unsuccessful.  Writer left a HIPPA compliant voice message.  ° °Per chart review, patient was provided with outpatient resources. ° °

## 2021-02-12 ENCOUNTER — Ambulatory Visit
Admission: EM | Admit: 2021-02-12 | Discharge: 2021-02-12 | Disposition: A | Discharge status: Home / Self Care | Payer: BC Managed Care – PPO | Attending: Internal Medicine | Admitting: Internal Medicine

## 2021-02-12 ENCOUNTER — Other Ambulatory Visit: Payer: Self-pay

## 2021-02-12 ENCOUNTER — Encounter: Payer: Self-pay | Admitting: Emergency Medicine

## 2021-02-12 DIAGNOSIS — R202 Paresthesia of skin: Secondary | ICD-10-CM

## 2021-02-12 MED ORDER — GABAPENTIN 100 MG PO CAPS: 100.0000 mg | ORAL_CAPSULE | Freq: Two times a day (BID) | ORAL | 0 refills | Status: DC

## 2021-02-12 NOTE — ED Provider Notes (Signed)
[facility]    CSN: [id number] Arrival date & time: 02/12/21  1654      History   Chief Complaint No chief complaint on file.   HPI Samuel AbtsJohn E Andrade is a 41 y.o. male.   Patient presents for numbness and tingling in feet that extends up the leg slightly that has been present intermittently over the past few months.  Patient reports that it flared up today and started this morning and has been persistent.  Denies any pain in the feet.  No aggravating or relieving factors to numbness or tingling.  Denies any injuries to the area.  Patient does have a history of back pain with disc protrusion but reports that he has not had any back pain since these symptoms started.  No difficulties walking.  Patient denies any pertinent medical history including diabetes.    Past Medical History:  Diagnosis Date   Anxiety    Depression    Neuromuscular disorder (HCC)    Opioid abuse (HCC)    Opioid overdose (HCC)    Substance abuse (HCC)     Patient Active Problem List   Diagnosis Date Noted   Low back pain 05/19/2018   History of substance abuse (HCC) 11/14/2017   Hepatitis C virus infection cured after antiviral drug therapy 08/19/2014   Tobacco abuse 08/19/2014    Past Surgical History:  Procedure Laterality Date   FRACTURE SURGERY     HERNIA REPAIR     SPINE SURGERY         Home Medications    Prior to Admission medications   Medication Sig Start Date End Date Taking? Authorizing Provider  gabapentin (NEURONTIN) 100 MG capsule Take 1 capsule (100 mg total) by mouth 2 (two) times daily. 02/12/21  Yes , Acie FredricksonHaley E, FNP    Family History Family History  Problem Relation Age of Onset   Hypertension Father    Healthy Mother    Stroke Maternal Grandmother    Mental illness Maternal Grandmother    Stroke Maternal Grandfather    Heart disease Maternal Grandfather    Stroke Paternal Grandmother    Diabetes Paternal Grandfather     Social History Social  History   Tobacco Use   Smoking status: Every Day    Packs/day: 0.50    Years: 9.00    Pack years: 4.50    Types: Cigarettes   Smokeless tobacco: Never  Vaping Use   Vaping Use: Never used  Substance Use Topics   Alcohol use: Yes    Comment: once a week   Drug use: Yes    Types: IV    Comment: heroin, Xanax     Allergies   Patient has no known allergies.   Review of Systems Review of Systems Per HPI  Physical Exam Triage Vital Signs ED Triage Vitals  Enc Vitals Group     BP 02/12/21 1709 (!) 157/92     Pulse Rate 02/12/21 1709 88     Resp 02/12/21 1709 16     Temp 02/12/21 1709 98.2 F (36.8 C)     Temp Source 02/12/21 1709 Oral     SpO2 02/12/21 1709 96 %     Weight --      Height --      Head Circumference --      Peak Flow --      Pain Score 02/12/21 1712 3     Pain Loc --      Pain Edu? --  Excl. in GC? --    No data found.  Updated Vital Signs BP (!) 157/92 (BP Location: Left Arm)    Pulse 88    Temp 98.2 F (36.8 C) (Oral)    Resp 16    SpO2 96%   Visual Acuity Right Eye Distance:   Left Eye Distance:   Bilateral Distance:    Right Eye Near:   Left Eye Near:    Bilateral Near:     Physical Exam Constitutional:      General: He is not in acute distress.    Appearance: Normal appearance. He is not toxic-appearing or diaphoretic.  HENT:     Head: Normocephalic and atraumatic.  Eyes:     Extraocular Movements: Extraocular movements intact.     Conjunctiva/sclera: Conjunctivae normal.  Cardiovascular:     Rate and Rhythm: Normal rate and regular rhythm.     Pulses: Normal pulses.     Heart sounds: Normal heart sounds.  Pulmonary:     Effort: Pulmonary effort is normal. No respiratory distress.     Breath sounds: Normal breath sounds.  Musculoskeletal:     Cervical back: Normal.     Thoracic back: Normal.     Lumbar back: Normal.     Comments: There are no obvious abnormalities noted to bilateral lower extremities.  No erythema,  abrasions, lacerations noted.  No signs of bacterial fungal infection.  Patient has full range of motion of all joints of lower extremity.  Gait is normal.  Neurovascular intact.  Neurological:     General: No focal deficit present.     Mental Status: He is alert and oriented to person, place, and time. Mental status is at baseline.     Deep Tendon Reflexes: Reflexes are normal and symmetric.  Psychiatric:        Mood and Affect: Mood normal.        Behavior: Behavior normal.        Thought Content: Thought content normal.        Judgment: Judgment normal.     UC Treatments / Results  Labs (all labs ordered are listed, but only abnormal results are displayed) Labs Reviewed  VITAMIN B12  BASIC METABOLIC PANEL    EKG   Radiology No results found.  Procedures Procedures (including critical care time)  Medications Ordered in UC Medications - No data to display  Initial Impression / Assessment and Plan / UC Course  I have reviewed the triage vital signs and the nursing notes.  Pertinent labs & imaging results that were available during my care of the patient were reviewed by me and considered in my medical decision making (see chart for details).     Differential diagnoses include diabetic peripheral neuropathy, peripheral vascular disease, disc disease causing symptoms, vitamin B12 deficiency.  Vitamin B12 and BMP pending to rule out these etiologies.  Low suspicion for patient's past medical history of back pain causing patient symptoms but it is possible.  Physical exam is fairly benign so do not think the patient is in need of immediate medical attention by specialists or hospital at this time.  Will prescribe gabapentin to help alleviate patient's symptoms.  Discussed return precautions.  Patient to follow-up with PCP for further evaluation and management.  Patient verbalized understanding and was agreeable with plan.  Clinical course discussed with supervising  physician. Final Clinical Impressions(s) / UC Diagnoses   Final diagnoses:  Paresthesia of both feet     Discharge Instructions  Blood work is pending.  You have been prescribed gabapentin.  Please follow-up with primary care doctor for further evaluation and management.    ED Prescriptions     Medication Sig Dispense Auth. Provider   gabapentin (NEURONTIN) 100 MG capsule Take 1 capsule (100 mg total) by mouth 2 (two) times daily. 60 capsule [place], Acie Fredrickson, [place]      PDMP not reviewed this encounter.   Gustavus Bryant, [place] 02/12/21 1757

## 2021-02-12 NOTE — Discharge Instructions (Signed)
Blood work is pending.  You have been prescribed gabapentin.  Please follow-up with primary care doctor for further evaluation and management.

## 2021-02-12 NOTE — ED Triage Notes (Signed)
Tingling in feet, occasionally going up legs, intermittent. Starting today and has continued through the day. Has been happening intermittently over the last few months.

## 2021-02-13 LAB — BASIC METABOLIC PANEL
BUN/Creatinine Ratio: 20 (ref 9–20)
BUN: 21 mg/dL (ref 6–24)
CO2: 23 mmol/L (ref 20–29)
Calcium: 9.6 mg/dL (ref 8.7–10.2)
Chloride: 97 mmol/L (ref 96–106)
Creatinine, Ser: 1.04 mg/dL (ref 0.76–1.27)
Glucose: 74 mg/dL (ref 70–99)
Potassium: 4 mmol/L (ref 3.5–5.2)
Sodium: 136 mmol/L (ref 134–144)
eGFR: 93 mL/min/{1.73_m2} (ref >59)

## 2021-02-13 LAB — VITAMIN B12: Vitamin B-12: 227 pg/mL — ABNORMAL LOW (ref 232–1245)

## 2021-02-16 ENCOUNTER — Other Ambulatory Visit: Payer: Self-pay

## 2021-02-16 ENCOUNTER — Ambulatory Visit
Admission: RE | Admit: 2021-02-16 | Discharge: 2021-02-16 | Disposition: A | Discharge status: Other Acute Inpatient Hospital | Payer: BC Managed Care – PPO | Source: Ambulatory Visit | Attending: Family Medicine | Admitting: Family Medicine

## 2021-02-16 VITALS — BP 152/83 | HR 75 | Temp 97.9°F | Resp 16

## 2021-02-16 DIAGNOSIS — M79605 Pain in left leg: Secondary | ICD-10-CM

## 2021-02-16 NOTE — Discharge Instructions (Signed)
Please go to the hospital as soon as you leave urgent care for further evaluation and management. 

## 2021-02-16 NOTE — ED Triage Notes (Signed)
Pt presents with concerns that his L calf was swollen and he could feel heartbeat in it yesterday.  No swelling or pain today.  States his L ankle may be swollen.  No noticeable swelling noted during intake.

## 2021-02-16 NOTE — ED Provider Notes (Signed)
[facility]    CSN: [id number] Arrival date & time: 02/16/21  1702      History   Chief Complaint Chief Complaint  Patient presents with   Leg Pain    5:00 Appt     HPI Samuel Andrade is a 41 y.o. male.   Patient presents with posterior left knee and calf pain that has been present since yesterday.  Patient reports that the pain is a "pulsing pain" and is intermittent.  Denies any current pain.  No aggravating or relieving factors to pain.  Patient was recently seen a few days ago for bilateral leg paresthesias.  Patient reports that this is a different sensation.  Denies any recent surgeries or prolonged travel recently.  Patient denies taking any medication to help alleviate pain.  Denies any injury to the leg.   Leg Pain  Past Medical History:  Diagnosis Date   Anxiety    Depression    Neuromuscular disorder (HCC)    Opioid abuse (HCC)    Opioid overdose (HCC)    Substance abuse (HCC)     Patient Active Problem List   Diagnosis Date Noted   Low back pain 05/19/2018   History of substance abuse (HCC) 11/14/2017   Hepatitis C virus infection cured after antiviral drug therapy 08/19/2014   Tobacco abuse 08/19/2014    Past Surgical History:  Procedure Laterality Date   FRACTURE SURGERY     HERNIA REPAIR     SPINE SURGERY         Home Medications    Prior to Admission medications   Medication Sig Start Date End Date Taking? Authorizing Provider  gabapentin (NEURONTIN) 100 MG capsule Take 1 capsule (100 mg total) by mouth 2 (two) times daily. 02/12/21  Yes , Acie Fredrickson, FNP    Family History Family History  Problem Relation Age of Onset   Hypertension Father    Healthy Mother    Stroke Maternal Grandmother    Mental illness Maternal Grandmother    Stroke Maternal Grandfather    Heart disease Maternal Grandfather    Stroke Paternal Grandmother    Diabetes Paternal Grandfather     Social History Social History   Tobacco Use    Smoking status: Every Day    Packs/day: 0.50    Years: 9.00    Pack years: 4.50    Types: Cigarettes   Smokeless tobacco: Never  Vaping Use   Vaping Use: Never used  Substance Use Topics   Alcohol use: Yes    Comment: rarely   Drug use: Not Currently    Types: IV    Comment: heroin, Xanax     Allergies   Patient has no known allergies.   Review of Systems Review of Systems Per HPI  Physical Exam Triage Vital Signs ED Triage Vitals  Enc Vitals Group     BP 02/16/21 1748 (!) 152/83     Pulse Rate 02/16/21 1748 75     Resp 02/16/21 1748 16     Temp 02/16/21 1748 97.9 F (36.6 C)     Temp Source 02/16/21 1748 Oral     SpO2 02/16/21 1748 97 %     Weight --      Height --      Head Circumference --      Peak Flow --      Pain Score 02/16/21 1747 0     Pain Loc --      Pain Edu? --  Excl. in GC? --    No data found.  Updated Vital Signs BP (!) 152/83 (BP Location: Left Arm)    Pulse 75    Temp 97.9 F (36.6 C) (Oral)    Resp 16    SpO2 97%   Visual Acuity Right Eye Distance:   Left Eye Distance:   Bilateral Distance:    Right Eye Near:   Left Eye Near:    Bilateral Near:     Physical Exam Constitutional:      General: He is not in acute distress.    Appearance: Normal appearance. He is not toxic-appearing or diaphoretic.  HENT:     Head: Normocephalic and atraumatic.  Eyes:     Extraocular Movements: Extraocular movements intact.     Conjunctiva/sclera: Conjunctivae normal.  Pulmonary:     Effort: Pulmonary effort is normal.  Musculoskeletal:     Comments: No tenderness to palpation throughout left lower extremity.  No obvious swelling or bruising noted.  There is mild erythema located to left lower extremity directly above ankle.  Patient has full range of motion of knee and leg.  Patient points to left posterior knee when asked where pain occurs.  Neurovascular intact.  Neurological:     General: No focal deficit present.     Mental Status: He  is alert and oriented to person, place, and time. Mental status is at baseline.  Psychiatric:        Mood and Affect: Mood normal.        Behavior: Behavior normal.        Thought Content: Thought content normal.        Judgment: Judgment normal.     UC Treatments / Results  Labs (all labs ordered are listed, but only abnormal results are displayed) Labs Reviewed - No data to display  EKG   Radiology No results found.  Procedures Procedures (including critical care time)  Medications Ordered in UC Medications - No data to display  Initial Impression / Assessment and Plan / UC Course  I have reviewed the triage vital signs and the nursing notes.  Pertinent labs & imaging results that were available during my care of the patient were reviewed by me and considered in my medical decision making (see chart for details).     It is possible that patient's neuropathic symptoms from previous visit could be exacerbated that is causing this posterior leg pain.  Although, I do think that DVT needs to be ruled out given characteristic and area of pain.  I do not have the ability to order an ultrasound at urgent care at this time.  Advised patient to go to the hospital to have ultrasound and further evaluation and management completed.  Patient was agreeable with plan.  Vitals stable at discharge.  Agree with patient self transport to the hospital. Final Clinical Impressions(s) / UC Diagnoses   Final diagnoses:  Pain of left lower extremity     Discharge Instructions      Please go to the hospital as soon as you leave urgent care for further evaluation and management.    ED Prescriptions   None    PDMP not reviewed this encounter.   Gustavus Bryant, [place] 02/16/21 1820

## 2021-03-04 DIAGNOSIS — F1510 Other stimulant abuse, uncomplicated: Secondary | ICD-10-CM | POA: Diagnosis not present

## 2021-03-04 DIAGNOSIS — F17210 Nicotine dependence, cigarettes, uncomplicated: Secondary | ICD-10-CM | POA: Diagnosis not present

## 2021-03-04 DIAGNOSIS — Z6830 Body mass index (BMI) 30.0-30.9, adult: Secondary | ICD-10-CM | POA: Diagnosis not present

## 2021-03-04 DIAGNOSIS — B182 Chronic viral hepatitis C: Secondary | ICD-10-CM | POA: Diagnosis not present

## 2021-05-05 DIAGNOSIS — F1121 Opioid dependence, in remission: Secondary | ICD-10-CM | POA: Diagnosis not present

## 2021-05-05 DIAGNOSIS — F15288 Other stimulant dependence with other stimulant-induced disorder: Secondary | ICD-10-CM | POA: Diagnosis not present

## 2021-06-03 DIAGNOSIS — F3289 Other specified depressive episodes: Secondary | ICD-10-CM | POA: Diagnosis not present

## 2021-06-03 DIAGNOSIS — F1510 Other stimulant abuse, uncomplicated: Secondary | ICD-10-CM | POA: Diagnosis not present

## 2022-02-08 DIAGNOSIS — F29 Unspecified psychosis not due to a substance or known physiological condition: Secondary | ICD-10-CM | POA: Diagnosis not present

## 2022-03-01 DIAGNOSIS — F29 Unspecified psychosis not due to a substance or known physiological condition: Secondary | ICD-10-CM | POA: Diagnosis not present

## 2022-03-15 DIAGNOSIS — F29 Unspecified psychosis not due to a substance or known physiological condition: Secondary | ICD-10-CM | POA: Diagnosis not present

## 2022-04-26 ENCOUNTER — Encounter: Payer: Self-pay | Admitting: *Deleted

## 2022-04-29 DIAGNOSIS — F1520 Other stimulant dependence, uncomplicated: Secondary | ICD-10-CM | POA: Diagnosis not present

## 2022-05-03 DIAGNOSIS — F4323 Adjustment disorder with mixed anxiety and depressed mood: Secondary | ICD-10-CM | POA: Diagnosis not present

## 2022-05-18 ENCOUNTER — Ambulatory Visit (INDEPENDENT_AMBULATORY_CARE_PROVIDER_SITE_OTHER): Payer: BC Managed Care – PPO | Admitting: Psychiatry

## 2022-06-24 DIAGNOSIS — F419 Anxiety disorder, unspecified: Secondary | ICD-10-CM | POA: Diagnosis not present

## 2022-06-24 DIAGNOSIS — F17200 Nicotine dependence, unspecified, uncomplicated: Secondary | ICD-10-CM | POA: Diagnosis not present

## 2022-06-24 DIAGNOSIS — F1121 Opioid dependence, in remission: Secondary | ICD-10-CM | POA: Diagnosis not present

## 2022-06-24 DIAGNOSIS — F1520 Other stimulant dependence, uncomplicated: Secondary | ICD-10-CM | POA: Diagnosis not present

## 2022-06-24 DIAGNOSIS — F1321 Sedative, hypnotic or anxiolytic dependence, in remission: Secondary | ICD-10-CM | POA: Diagnosis not present

## 2022-06-29 DIAGNOSIS — F1520 Other stimulant dependence, uncomplicated: Secondary | ICD-10-CM | POA: Diagnosis not present

## 2022-06-29 DIAGNOSIS — F419 Anxiety disorder, unspecified: Secondary | ICD-10-CM | POA: Diagnosis not present

## 2022-06-29 DIAGNOSIS — F1321 Sedative, hypnotic or anxiolytic dependence, in remission: Secondary | ICD-10-CM | POA: Diagnosis not present

## 2022-06-29 DIAGNOSIS — F1121 Opioid dependence, in remission: Secondary | ICD-10-CM | POA: Diagnosis not present

## 2022-06-29 DIAGNOSIS — F17200 Nicotine dependence, unspecified, uncomplicated: Secondary | ICD-10-CM | POA: Diagnosis not present

## 2022-06-30 LAB — LAB REPORT - SCANNED
EGFR: 101
HM Hepatitis Screen: POSITIVE

## 2022-07-05 DIAGNOSIS — F1520 Other stimulant dependence, uncomplicated: Secondary | ICD-10-CM | POA: Diagnosis not present

## 2022-07-12 DIAGNOSIS — F1520 Other stimulant dependence, uncomplicated: Secondary | ICD-10-CM | POA: Diagnosis not present

## 2022-07-13 DIAGNOSIS — F1121 Opioid dependence, in remission: Secondary | ICD-10-CM | POA: Diagnosis not present

## 2022-07-13 DIAGNOSIS — F419 Anxiety disorder, unspecified: Secondary | ICD-10-CM | POA: Diagnosis not present

## 2022-07-13 DIAGNOSIS — F17200 Nicotine dependence, unspecified, uncomplicated: Secondary | ICD-10-CM | POA: Diagnosis not present

## 2022-07-13 DIAGNOSIS — F1321 Sedative, hypnotic or anxiolytic dependence, in remission: Secondary | ICD-10-CM | POA: Diagnosis not present

## 2022-07-13 DIAGNOSIS — F1520 Other stimulant dependence, uncomplicated: Secondary | ICD-10-CM | POA: Diagnosis not present

## 2022-07-14 DIAGNOSIS — F1321 Sedative, hypnotic or anxiolytic dependence, in remission: Secondary | ICD-10-CM | POA: Diagnosis not present

## 2022-07-14 DIAGNOSIS — F17200 Nicotine dependence, unspecified, uncomplicated: Secondary | ICD-10-CM | POA: Diagnosis not present

## 2022-07-14 DIAGNOSIS — F1121 Opioid dependence, in remission: Secondary | ICD-10-CM | POA: Diagnosis not present

## 2022-07-14 DIAGNOSIS — F419 Anxiety disorder, unspecified: Secondary | ICD-10-CM | POA: Diagnosis not present

## 2022-07-14 DIAGNOSIS — F1520 Other stimulant dependence, uncomplicated: Secondary | ICD-10-CM | POA: Diagnosis not present

## 2022-07-15 DIAGNOSIS — F17200 Nicotine dependence, unspecified, uncomplicated: Secondary | ICD-10-CM | POA: Diagnosis not present

## 2022-07-15 DIAGNOSIS — F1321 Sedative, hypnotic or anxiolytic dependence, in remission: Secondary | ICD-10-CM | POA: Diagnosis not present

## 2022-07-15 DIAGNOSIS — F419 Anxiety disorder, unspecified: Secondary | ICD-10-CM | POA: Diagnosis not present

## 2022-07-15 DIAGNOSIS — F1121 Opioid dependence, in remission: Secondary | ICD-10-CM | POA: Diagnosis not present

## 2022-07-15 DIAGNOSIS — F1520 Other stimulant dependence, uncomplicated: Secondary | ICD-10-CM | POA: Diagnosis not present

## 2022-07-16 DIAGNOSIS — F1121 Opioid dependence, in remission: Secondary | ICD-10-CM | POA: Diagnosis not present

## 2022-07-16 DIAGNOSIS — F1321 Sedative, hypnotic or anxiolytic dependence, in remission: Secondary | ICD-10-CM | POA: Diagnosis not present

## 2022-07-16 DIAGNOSIS — F419 Anxiety disorder, unspecified: Secondary | ICD-10-CM | POA: Diagnosis not present

## 2022-07-16 DIAGNOSIS — F1520 Other stimulant dependence, uncomplicated: Secondary | ICD-10-CM | POA: Diagnosis not present

## 2022-07-16 DIAGNOSIS — F17200 Nicotine dependence, unspecified, uncomplicated: Secondary | ICD-10-CM | POA: Diagnosis not present

## 2022-07-17 DIAGNOSIS — F1121 Opioid dependence, in remission: Secondary | ICD-10-CM | POA: Diagnosis not present

## 2022-07-17 DIAGNOSIS — F1520 Other stimulant dependence, uncomplicated: Secondary | ICD-10-CM | POA: Diagnosis not present

## 2022-07-17 DIAGNOSIS — F17200 Nicotine dependence, unspecified, uncomplicated: Secondary | ICD-10-CM | POA: Diagnosis not present

## 2022-07-17 DIAGNOSIS — F1321 Sedative, hypnotic or anxiolytic dependence, in remission: Secondary | ICD-10-CM | POA: Diagnosis not present

## 2022-07-17 DIAGNOSIS — F419 Anxiety disorder, unspecified: Secondary | ICD-10-CM | POA: Diagnosis not present

## 2022-07-18 DIAGNOSIS — F1520 Other stimulant dependence, uncomplicated: Secondary | ICD-10-CM | POA: Diagnosis not present

## 2022-07-18 DIAGNOSIS — F1121 Opioid dependence, in remission: Secondary | ICD-10-CM | POA: Diagnosis not present

## 2022-07-18 DIAGNOSIS — F1321 Sedative, hypnotic or anxiolytic dependence, in remission: Secondary | ICD-10-CM | POA: Diagnosis not present

## 2022-07-18 DIAGNOSIS — F17200 Nicotine dependence, unspecified, uncomplicated: Secondary | ICD-10-CM | POA: Diagnosis not present

## 2022-07-18 DIAGNOSIS — F419 Anxiety disorder, unspecified: Secondary | ICD-10-CM | POA: Diagnosis not present

## 2022-07-19 DIAGNOSIS — F1520 Other stimulant dependence, uncomplicated: Secondary | ICD-10-CM | POA: Diagnosis not present

## 2022-07-19 DIAGNOSIS — F1121 Opioid dependence, in remission: Secondary | ICD-10-CM | POA: Diagnosis not present

## 2022-07-19 DIAGNOSIS — F17200 Nicotine dependence, unspecified, uncomplicated: Secondary | ICD-10-CM | POA: Diagnosis not present

## 2022-07-19 DIAGNOSIS — F1321 Sedative, hypnotic or anxiolytic dependence, in remission: Secondary | ICD-10-CM | POA: Diagnosis not present

## 2022-07-19 DIAGNOSIS — F419 Anxiety disorder, unspecified: Secondary | ICD-10-CM | POA: Diagnosis not present

## 2022-07-20 DIAGNOSIS — F1321 Sedative, hypnotic or anxiolytic dependence, in remission: Secondary | ICD-10-CM | POA: Diagnosis not present

## 2022-07-20 DIAGNOSIS — F419 Anxiety disorder, unspecified: Secondary | ICD-10-CM | POA: Diagnosis not present

## 2022-07-20 DIAGNOSIS — F1520 Other stimulant dependence, uncomplicated: Secondary | ICD-10-CM | POA: Diagnosis not present

## 2022-07-20 DIAGNOSIS — F17200 Nicotine dependence, unspecified, uncomplicated: Secondary | ICD-10-CM | POA: Diagnosis not present

## 2022-07-20 DIAGNOSIS — F1121 Opioid dependence, in remission: Secondary | ICD-10-CM | POA: Diagnosis not present

## 2022-07-21 DIAGNOSIS — F17200 Nicotine dependence, unspecified, uncomplicated: Secondary | ICD-10-CM | POA: Diagnosis not present

## 2022-07-21 DIAGNOSIS — F1520 Other stimulant dependence, uncomplicated: Secondary | ICD-10-CM | POA: Diagnosis not present

## 2022-07-21 DIAGNOSIS — F1321 Sedative, hypnotic or anxiolytic dependence, in remission: Secondary | ICD-10-CM | POA: Diagnosis not present

## 2022-07-21 DIAGNOSIS — F1121 Opioid dependence, in remission: Secondary | ICD-10-CM | POA: Diagnosis not present

## 2022-07-21 DIAGNOSIS — F419 Anxiety disorder, unspecified: Secondary | ICD-10-CM | POA: Diagnosis not present

## 2022-07-22 DIAGNOSIS — F1121 Opioid dependence, in remission: Secondary | ICD-10-CM | POA: Diagnosis not present

## 2022-07-22 DIAGNOSIS — F1520 Other stimulant dependence, uncomplicated: Secondary | ICD-10-CM | POA: Diagnosis not present

## 2022-07-22 DIAGNOSIS — F419 Anxiety disorder, unspecified: Secondary | ICD-10-CM | POA: Diagnosis not present

## 2022-07-22 DIAGNOSIS — F17200 Nicotine dependence, unspecified, uncomplicated: Secondary | ICD-10-CM | POA: Diagnosis not present

## 2022-07-22 DIAGNOSIS — F1321 Sedative, hypnotic or anxiolytic dependence, in remission: Secondary | ICD-10-CM | POA: Diagnosis not present

## 2022-07-23 DIAGNOSIS — F419 Anxiety disorder, unspecified: Secondary | ICD-10-CM | POA: Diagnosis not present

## 2022-07-23 DIAGNOSIS — F1321 Sedative, hypnotic or anxiolytic dependence, in remission: Secondary | ICD-10-CM | POA: Diagnosis not present

## 2022-07-23 DIAGNOSIS — F1121 Opioid dependence, in remission: Secondary | ICD-10-CM | POA: Diagnosis not present

## 2022-07-23 DIAGNOSIS — F1520 Other stimulant dependence, uncomplicated: Secondary | ICD-10-CM | POA: Diagnosis not present

## 2022-07-23 DIAGNOSIS — F17200 Nicotine dependence, unspecified, uncomplicated: Secondary | ICD-10-CM | POA: Diagnosis not present

## 2022-07-24 DIAGNOSIS — F1121 Opioid dependence, in remission: Secondary | ICD-10-CM | POA: Diagnosis not present

## 2022-07-24 DIAGNOSIS — F419 Anxiety disorder, unspecified: Secondary | ICD-10-CM | POA: Diagnosis not present

## 2022-07-24 DIAGNOSIS — F1321 Sedative, hypnotic or anxiolytic dependence, in remission: Secondary | ICD-10-CM | POA: Diagnosis not present

## 2022-07-24 DIAGNOSIS — F1520 Other stimulant dependence, uncomplicated: Secondary | ICD-10-CM | POA: Diagnosis not present

## 2022-07-24 DIAGNOSIS — F17200 Nicotine dependence, unspecified, uncomplicated: Secondary | ICD-10-CM | POA: Diagnosis not present

## 2022-07-25 DIAGNOSIS — F1121 Opioid dependence, in remission: Secondary | ICD-10-CM | POA: Diagnosis not present

## 2022-07-25 DIAGNOSIS — F1520 Other stimulant dependence, uncomplicated: Secondary | ICD-10-CM | POA: Diagnosis not present

## 2022-07-25 DIAGNOSIS — F1321 Sedative, hypnotic or anxiolytic dependence, in remission: Secondary | ICD-10-CM | POA: Diagnosis not present

## 2022-07-25 DIAGNOSIS — F17200 Nicotine dependence, unspecified, uncomplicated: Secondary | ICD-10-CM | POA: Diagnosis not present

## 2022-07-25 DIAGNOSIS — F419 Anxiety disorder, unspecified: Secondary | ICD-10-CM | POA: Diagnosis not present

## 2022-07-26 DIAGNOSIS — F419 Anxiety disorder, unspecified: Secondary | ICD-10-CM | POA: Diagnosis not present

## 2022-07-26 DIAGNOSIS — F17200 Nicotine dependence, unspecified, uncomplicated: Secondary | ICD-10-CM | POA: Diagnosis not present

## 2022-07-26 DIAGNOSIS — F1121 Opioid dependence, in remission: Secondary | ICD-10-CM | POA: Diagnosis not present

## 2022-07-26 DIAGNOSIS — F1321 Sedative, hypnotic or anxiolytic dependence, in remission: Secondary | ICD-10-CM | POA: Diagnosis not present

## 2022-07-26 DIAGNOSIS — F1520 Other stimulant dependence, uncomplicated: Secondary | ICD-10-CM | POA: Diagnosis not present

## 2022-07-27 DIAGNOSIS — F17200 Nicotine dependence, unspecified, uncomplicated: Secondary | ICD-10-CM | POA: Diagnosis not present

## 2022-07-27 DIAGNOSIS — F1520 Other stimulant dependence, uncomplicated: Secondary | ICD-10-CM | POA: Diagnosis not present

## 2022-07-27 DIAGNOSIS — F419 Anxiety disorder, unspecified: Secondary | ICD-10-CM | POA: Diagnosis not present

## 2022-07-27 DIAGNOSIS — F1321 Sedative, hypnotic or anxiolytic dependence, in remission: Secondary | ICD-10-CM | POA: Diagnosis not present

## 2022-07-27 DIAGNOSIS — F1121 Opioid dependence, in remission: Secondary | ICD-10-CM | POA: Diagnosis not present

## 2022-07-28 DIAGNOSIS — F1520 Other stimulant dependence, uncomplicated: Secondary | ICD-10-CM | POA: Diagnosis not present

## 2022-07-28 DIAGNOSIS — F17200 Nicotine dependence, unspecified, uncomplicated: Secondary | ICD-10-CM | POA: Diagnosis not present

## 2022-07-28 DIAGNOSIS — F419 Anxiety disorder, unspecified: Secondary | ICD-10-CM | POA: Diagnosis not present

## 2022-07-28 DIAGNOSIS — F1121 Opioid dependence, in remission: Secondary | ICD-10-CM | POA: Diagnosis not present

## 2022-07-28 DIAGNOSIS — F1321 Sedative, hypnotic or anxiolytic dependence, in remission: Secondary | ICD-10-CM | POA: Diagnosis not present

## 2022-07-29 DIAGNOSIS — F1321 Sedative, hypnotic or anxiolytic dependence, in remission: Secondary | ICD-10-CM | POA: Diagnosis not present

## 2022-07-29 DIAGNOSIS — F1520 Other stimulant dependence, uncomplicated: Secondary | ICD-10-CM | POA: Diagnosis not present

## 2022-07-29 DIAGNOSIS — F17200 Nicotine dependence, unspecified, uncomplicated: Secondary | ICD-10-CM | POA: Diagnosis not present

## 2022-07-29 DIAGNOSIS — F1121 Opioid dependence, in remission: Secondary | ICD-10-CM | POA: Diagnosis not present

## 2022-07-29 DIAGNOSIS — F419 Anxiety disorder, unspecified: Secondary | ICD-10-CM | POA: Diagnosis not present

## 2022-07-30 DIAGNOSIS — F1520 Other stimulant dependence, uncomplicated: Secondary | ICD-10-CM | POA: Diagnosis not present

## 2022-07-30 DIAGNOSIS — F1121 Opioid dependence, in remission: Secondary | ICD-10-CM | POA: Diagnosis not present

## 2022-07-30 DIAGNOSIS — F17200 Nicotine dependence, unspecified, uncomplicated: Secondary | ICD-10-CM | POA: Diagnosis not present

## 2022-07-30 DIAGNOSIS — F1321 Sedative, hypnotic or anxiolytic dependence, in remission: Secondary | ICD-10-CM | POA: Diagnosis not present

## 2022-07-30 DIAGNOSIS — F419 Anxiety disorder, unspecified: Secondary | ICD-10-CM | POA: Diagnosis not present

## 2022-07-31 DIAGNOSIS — F1321 Sedative, hypnotic or anxiolytic dependence, in remission: Secondary | ICD-10-CM | POA: Diagnosis not present

## 2022-07-31 DIAGNOSIS — F1121 Opioid dependence, in remission: Secondary | ICD-10-CM | POA: Diagnosis not present

## 2022-07-31 DIAGNOSIS — F419 Anxiety disorder, unspecified: Secondary | ICD-10-CM | POA: Diagnosis not present

## 2022-07-31 DIAGNOSIS — F1520 Other stimulant dependence, uncomplicated: Secondary | ICD-10-CM | POA: Diagnosis not present

## 2022-07-31 DIAGNOSIS — F17200 Nicotine dependence, unspecified, uncomplicated: Secondary | ICD-10-CM | POA: Diagnosis not present

## 2022-08-01 DIAGNOSIS — F1520 Other stimulant dependence, uncomplicated: Secondary | ICD-10-CM | POA: Diagnosis not present

## 2022-08-01 DIAGNOSIS — F1321 Sedative, hypnotic or anxiolytic dependence, in remission: Secondary | ICD-10-CM | POA: Diagnosis not present

## 2022-08-01 DIAGNOSIS — F17200 Nicotine dependence, unspecified, uncomplicated: Secondary | ICD-10-CM | POA: Diagnosis not present

## 2022-08-01 DIAGNOSIS — F419 Anxiety disorder, unspecified: Secondary | ICD-10-CM | POA: Diagnosis not present

## 2022-08-01 DIAGNOSIS — F1121 Opioid dependence, in remission: Secondary | ICD-10-CM | POA: Diagnosis not present

## 2022-08-02 DIAGNOSIS — F1321 Sedative, hypnotic or anxiolytic dependence, in remission: Secondary | ICD-10-CM | POA: Diagnosis not present

## 2022-08-02 DIAGNOSIS — F419 Anxiety disorder, unspecified: Secondary | ICD-10-CM | POA: Diagnosis not present

## 2022-08-02 DIAGNOSIS — F1121 Opioid dependence, in remission: Secondary | ICD-10-CM | POA: Diagnosis not present

## 2022-08-02 DIAGNOSIS — F17200 Nicotine dependence, unspecified, uncomplicated: Secondary | ICD-10-CM | POA: Diagnosis not present

## 2022-08-02 DIAGNOSIS — F1520 Other stimulant dependence, uncomplicated: Secondary | ICD-10-CM | POA: Diagnosis not present

## 2022-08-08 ENCOUNTER — Ambulatory Visit
Admission: EM | Admit: 2022-08-08 | Discharge: 2022-08-08 | Disposition: A | Discharge status: Home / Self Care | Payer: BC Managed Care – PPO | Attending: Physician Assistant | Admitting: Physician Assistant

## 2022-08-08 DIAGNOSIS — S90821A Blister (nonthermal), right foot, initial encounter: Secondary | ICD-10-CM | POA: Diagnosis not present

## 2022-08-08 DIAGNOSIS — K0889 Other specified disorders of teeth and supporting structures: Secondary | ICD-10-CM

## 2022-08-08 MED ORDER — NAPROXEN 500 MG PO TABS: 500.0000 mg | ORAL_TABLET | Freq: Two times a day (BID) | ORAL | 0 refills | Status: DC

## 2022-08-08 MED ORDER — AMOXICILLIN-POT CLAVULANATE 875-125 MG PO TABS: 1.0000 | ORAL_TABLET | Freq: Two times a day (BID) | ORAL | 0 refills | Status: DC

## 2022-08-08 NOTE — Discharge Instructions (Signed)
Take antibiotic as prescribed for presumed dental infection Can take naprosyn as needed Apply cold compress Call dentist tomorrow.   The blister on your foot will slowly heal.  Keep clean.  Do not remove top layer of skin as it heals.

## 2022-08-08 NOTE — ED Triage Notes (Signed)
Pt presents to UC w/ c/o left upper mouth and ear pain x2 days. Has taken ibuprofen for pain relief.  Pt reports pain under right foot. A popped blister is present on the ball of the foot. No drainage present.

## 2022-08-08 NOTE — ED Provider Notes (Signed)
[facility]    CSN: [id number] Arrival date & time: 08/08/22  1005      History   Chief Complaint Chief Complaint  Patient presents with   Dental Pain    HPI Samuel Andrade is a 42 y.o. male.   Patient complains of left upper dental pain that started several days ago.  He reports pain is now in his ear as well.  He has taken ibuprofen with minimal relief.  He denies fever, chills, nausea, vomiting.  He reports some swelling to the left side of his face.  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  Last seen in January.  He also complains of a blister to the ball of his right foot.  Reports it started after he was on his feet a lot.  He reports some drainage from the area.  Denies redness, swelling, pain.    Past Medical History:  Diagnosis Date   Anxiety    Depression    Neuromuscular disorder (HCC)    Opioid abuse (HCC)    Opioid overdose (HCC)    Substance abuse (HCC)     Patient Active Problem List   Diagnosis Date Noted   Low back pain 05/19/2018   History of substance abuse (HCC) 11/14/2017   Hepatitis C virus infection cured after antiviral drug therapy 08/19/2014   Tobacco abuse 08/19/2014    Past Surgical History:  Procedure Laterality Date   FRACTURE SURGERY     HERNIA REPAIR     SPINE SURGERY         Home Medications    Prior to Admission medications   Medication Sig Start Date End Date Taking? Authorizing Provider  amoxicillin-clavulanate (AUGMENTIN) 875-125 MG tablet Take 1 tablet by mouth every 12 (twelve) hours. 08/08/22  Yes Ward, Tylene Fantasia, PA-C  naproxen (NAPROSYN) 500 MG tablet Take 1 tablet (500 mg total) by mouth 2 (two) times daily. 08/08/22  Yes Ward, Tylene Fantasia, PA-C  gabapentin (NEURONTIN) 100 MG capsule Take 1 capsule (100 mg total) by mouth 2 (two) times daily. 02/12/21   Gustavus Bryant, FNP    Family History Family History  Problem Relation Age of Onset   Hypertension Father    Healthy Mother    Stroke Maternal Grandmother    Mental  illness Maternal Grandmother    Stroke Maternal Grandfather    Heart disease Maternal Grandfather    Stroke Paternal Grandmother    Diabetes Paternal Grandfather     Social History Social History   Tobacco Use   Smoking status: Every Day    Current packs/day: 0.50    Average packs/day: 0.5 packs/day for 9.0 years (4.5 ttl pk-yrs)    Types: Cigarettes   Smokeless tobacco: Never  Vaping Use   Vaping status: Never Used  Substance Use Topics   Alcohol use: Yes    Comment: rarely   Drug use: Not Currently    Types: IV    Comment: heroin, Xanax     Allergies   Patient has no known allergies.   Review of Systems Review of Systems  Constitutional:  Negative for chills and fever.  HENT:  Positive for dental problem. Negative for ear pain and sore throat.   Eyes:  Negative for pain and visual disturbance.  Respiratory:  Negative for cough and shortness of breath.   Cardiovascular:  Negative for chest pain and palpitations.  Gastrointestinal:  Negative for abdominal pain and vomiting.  Genitourinary:  Negative for dysuria and hematuria.  Musculoskeletal:  Negative  for arthralgias and back pain.  Skin:  Positive for wound. Negative for color change and rash.  Neurological:  Negative for seizures and syncope.  All other systems reviewed and are negative.    Physical Exam Triage Vital Signs ED Triage Vitals  Encounter Vitals Group     BP 08/08/22 1022 117/68     Systolic BP Percentile --      Diastolic BP Percentile --      Pulse Rate 08/08/22 1022 75     Resp 08/08/22 1022 16     Temp 08/08/22 1022 98.3 F (36.8 C)     Temp Source 08/08/22 1022 Oral     SpO2 08/08/22 1022 96 %     Weight --      Height --      Head Circumference --      Peak Flow --      Pain Score 08/08/22 1026 6     Pain Loc --      Pain Education --      Exclude from Growth Chart --    No data found.  Updated Vital Signs BP 117/68 (BP Location: Right Arm)   Pulse 75   Temp 98.3 F (36.8  C) (Oral)   Resp 16   SpO2 96%   Visual Acuity Right Eye Distance:   Left Eye Distance:   Bilateral Distance:    Right Eye Near:   Left Eye Near:    Bilateral Near:     Physical Exam Vitals and nursing note reviewed.  Constitutional:      General: He is not in acute distress.    Appearance: He is well-developed.  HENT:     Head: Normocephalic and atraumatic.     Comments: Swelling to left side of face.  TM of left ear normal.  No dental abscess identified.  No broken teeth. Eyes:     Conjunctiva/sclera: Conjunctivae normal.  Cardiovascular:     Rate and Rhythm: Normal rate and regular rhythm.     Heart sounds: No murmur heard. Pulmonary:     Effort: Pulmonary effort is normal. No respiratory distress.     Breath sounds: Normal breath sounds.  Abdominal:     Palpations: Abdomen is soft.     Tenderness: There is no abdominal tenderness.  Musculoskeletal:        General: No swelling.     Cervical back: Neck supple.       Feet:  Skin:    General: Skin is warm and dry.     Capillary Refill: Capillary refill takes less than 2 seconds.  Neurological:     Mental Status: He is alert.  Psychiatric:        Mood and Affect: Mood normal.      UC Treatments / Results  Labs (all labs ordered are listed, but only abnormal results are displayed) Labs Reviewed - No data to display  EKG   Radiology No results found.  Procedures Procedures (including critical care time)  Medications Ordered in UC Medications - No data to display  Initial Impression / Assessment and Plan / UC Course  I have reviewed the triage vital signs and the nursing notes.  Pertinent labs & imaging results that were available during my care of the patient were reviewed by me and considered in my medical decision making (see chart for details).     Will treat for possible dental infection with antibiotic.  Supportive care discussed.  Advised to follow-up with dentist.  Blister to the right  foot, friction blister.  Advised not to deroofed.  No infection noted Final Clinical Impressions(s) / UC Diagnoses   Final diagnoses:  Pain, dental  Blister (nonthermal), right foot, initial encounter     Discharge Instructions      Take antibiotic as prescribed for presumed dental infection Can take naprosyn as needed Apply cold compress Call dentist tomorrow.   The blister on your foot will slowly heal.  Keep clean.  Do not remove top layer of skin as it heals.    ED Prescriptions     Medication Sig Dispense Auth. Provider   amoxicillin-clavulanate (AUGMENTIN) 875-125 MG tablet Take 1 tablet by mouth every 12 (twelve) hours. 14 tablet Ward, Shanda Bumps Z, PA-C   naproxen (NAPROSYN) 500 MG tablet Take 1 tablet (500 mg total) by mouth 2 (two) times daily. 30 tablet Ward, Tylene Fantasia, PA-C      PDMP not reviewed this encounter.   Ward, Tylene Fantasia, PA-C 08/08/22 1045

## 2022-09-16 ENCOUNTER — Encounter: Payer: Self-pay | Admitting: Family Medicine

## 2022-09-16 ENCOUNTER — Ambulatory Visit (INDEPENDENT_AMBULATORY_CARE_PROVIDER_SITE_OTHER): Payer: BC Managed Care – PPO | Admitting: Family Medicine

## 2022-09-16 VITALS — BP 114/80 | HR 65 | Temp 98.1°F | Ht 75.0 in | Wt 220.6 lb

## 2022-09-16 DIAGNOSIS — Z720 Tobacco use: Secondary | ICD-10-CM | POA: Diagnosis not present

## 2022-09-16 DIAGNOSIS — Z8619 Personal history of other infectious and parasitic diseases: Secondary | ICD-10-CM | POA: Diagnosis not present

## 2022-09-16 DIAGNOSIS — Z789 Other specified health status: Secondary | ICD-10-CM | POA: Diagnosis not present

## 2022-09-16 NOTE — Progress Notes (Signed)
New Patient Office Visit  Subjective    Patient ID: Samuel Andrade, male    DOB: Dec 06, 1980  Age: 42 y.o. MRN: [id number]  CC:  Chief Complaint  Patient presents with   Establish Care    New pt wanting to [facility] care for labs from 6.24. Pt concerned with postive Hep A, B and C.     HPI Samuel Andrade presents to establish care. Encounter Diagnoses  Name Primary?   History of hepatitis C Yes   History of hepatitis B    Tobacco use    Alcohol use    Presents today for physical exam and follow-up of his history of hep C and hep B.  He contracted these secondary to his history of IV drug abuse.  He has been clean since April when he attended a 6-week inpatient treatment center in [place].  He is now living with his parents.  He would like to go to [place] and live in a recovery supported community.  He is going to meetings.  He is exercising.  Hep C was treated with medication.  Chart review does show negative RNA testing 4 years ago.  He brings in an extensive list of blood work done at the treatment center.  CMP, CBC, TSH, urinalysis, were all normal.  Hep C testing was consistent with a history of above.  He tested negative for the hepatitis B surface antigen but positive for the core antibody.  He is exercise regularly and has regular dental care.  Outpatient Encounter Medications as of 09/16/2022  Medication Sig   [DISCONTINUED] amoxicillin-clavulanate (AUGMENTIN) 875-125 MG tablet Take 1 tablet by mouth every 12 (twelve) hours. (Patient not taking: Reported on 09/16/2022)   [DISCONTINUED] gabapentin (NEURONTIN) 100 MG capsule Take 1 capsule (100 mg total) by mouth 2 (two) times daily. (Patient not taking: Reported on 09/16/2022)   [DISCONTINUED] naproxen (NAPROSYN) 500 MG tablet Take 1 tablet (500 mg total) by mouth 2 (two) times daily. (Patient not taking: Reported on 09/16/2022)   No facility-administered encounter medications on file as of 09/16/2022.    Past Medical History:   Diagnosis Date   Anxiety    Depression    Neuromuscular disorder (HCC)    Opioid abuse (HCC)    Opioid overdose (HCC)    Substance abuse (HCC)     Past Surgical History:  Procedure Laterality Date   FRACTURE SURGERY     HERNIA REPAIR     SPINE SURGERY      Family History  Problem Relation Age of Onset   Hypertension Father    Healthy Mother    Stroke Maternal Grandmother    Mental illness Maternal Grandmother    Stroke Maternal Grandfather    Heart disease Maternal Grandfather    Stroke Paternal Grandmother    Diabetes Paternal Grandfather     Social History   Socioeconomic History   Marital status: Single    Spouse name: Not on file   Number of children: Not on file   Years of education: Not on file   Highest education level: Not on file  Occupational History   Not on file  Tobacco Use   Smoking status: Every Day    Current packs/day: 0.50    Average packs/day: 0.5 packs/day for 9.0 years (4.5 ttl pk-yrs)    Types: Cigarettes   Smokeless tobacco: Never  Vaping Use   Vaping status: Never Used  Substance and Sexual Activity   Alcohol use: Yes  Comment: rarely   Drug use: Not Currently    Types: IV    Comment: heroin, Xanax   Sexual activity: Not Currently  Other Topics Concern   Not on file  Social History Narrative   Not on file   Social Determinants of Health   Financial Resource Strain: Not on file  Food Insecurity: Not on file  Transportation Needs: Not on file  Physical Activity: Not on file  Stress: Not on file  Social Connections: Not on file  Intimate Partner Violence: Not on file    Review of Systems  Constitutional: Negative.   HENT: Negative.    Eyes:  Negative for blurred vision, discharge and redness.  Respiratory: Negative.    Cardiovascular: Negative.   Gastrointestinal:  Negative for abdominal pain.  Genitourinary: Negative.   Musculoskeletal: Negative.  Negative for myalgias.  Skin:  Negative for rash.  Neurological:   Negative for tingling, loss of consciousness and weakness.  Endo/Heme/Allergies:  Negative for polydipsia.      09/16/2022   10:05 AM 03/13/2018    3:47 PM 11/28/2017    2:16 PM  Depression screen PHQ 2/9  Decreased Interest 0 0 0  Down, Depressed, Hopeless 0 0 0  PHQ - 2 Score 0 0 0  Altered sleeping 0    Tired, decreased energy 1    Change in appetite 0    Feeling bad or failure about yourself  0    Trouble concentrating 0    Moving slowly or fidgety/restless 0    Suicidal thoughts 0    PHQ-9 Score 1    Difficult doing work/chores Not difficult at all            Objective    BP 114/80   Pulse 65   Temp 98.1 F (36.7 C)   Ht 6\' 3"  (1.905 m)   Wt 220 lb 9.6 oz (100.1 kg)   SpO2 98%   BMI 27.57 kg/m   Physical Exam Constitutional:      General: He is not in acute distress.    Appearance: Normal appearance. He is not ill-appearing, toxic-appearing or diaphoretic.  HENT:     Head: Normocephalic and atraumatic.     Right Ear: External ear normal.     Left Ear: External ear normal.     Mouth/Throat:     Mouth: Mucous membranes are moist.     Pharynx: Oropharynx is clear. No oropharyngeal exudate or posterior oropharyngeal erythema.  Eyes:     General: No scleral icterus.       Right eye: No discharge.        Left eye: No discharge.     Extraocular Movements: Extraocular movements intact.     Conjunctiva/sclera: Conjunctivae normal.     Pupils: Pupils are equal, round, and reactive to light.  Cardiovascular:     Rate and Rhythm: Normal rate and regular rhythm.  Pulmonary:     Effort: Pulmonary effort is normal. No respiratory distress.     Breath sounds: Normal breath sounds.  Abdominal:     General: Bowel sounds are normal.     Palpations: There is no mass.     Tenderness: There is no abdominal tenderness. There is no guarding or rebound.     Hernia: No hernia is present. There is no hernia in the left inguinal area or right inguinal area.  Genitourinary:     Penis: Circumcised. No hypospadias, erythema, tenderness, discharge, swelling or lesions.      Testes:  Right: Mass, tenderness or swelling not present. Right testis is descended.        Left: Mass, tenderness or swelling not present. Left testis is descended.     Epididymis:     Right: Not inflamed or enlarged.     Left: Not inflamed or enlarged.  Musculoskeletal:     Cervical back: No rigidity or tenderness.  Lymphadenopathy:     Lower Body: No right inguinal adenopathy. No left inguinal adenopathy.  Skin:    General: Skin is warm and dry.  Neurological:     Mental Status: He is alert and oriented to person, place, and time.  Psychiatric:        Mood and Affect: Mood normal.        Behavior: Behavior normal.         Assessment & Plan:   History of hepatitis C -     Hepatitis C RNA quantitative  History of hepatitis B -     Hepatitis B surface antigen -     Hepatitis B DNA, ultraquantitative, PCR  Tobacco use  Alcohol use     Return in about 6 months (around 03/16/2023), or if symptoms worsen or fail to improve.  I support the patient's desire to [facility] and we have in the recovery community that is mutually supportive.  I see no reason that he cannot go.  Encouraged him to abstain from alcohol.  Information was given on how to quit smoking.  Continue regular exercise. Mliss Sax, MD

## 2022-09-19 LAB — HEPATITIS C RNA QUANTITATIVE
HCV Quantitative Log: <1.18 {Log_IU}/mL
HCV RNA, PCR, QN: <15 [IU]/mL

## 2022-09-19 LAB — HEPATITIS B DNA, ULTRAQUANTITATIVE, PCR
Hepatitis B DNA: NOT DETECTED [IU]/mL
Hepatitis B virus DNA: NOT DETECTED {Log_IU}/mL

## 2022-09-19 LAB — HEPATITIS B SURFACE ANTIGEN: Hepatitis B Surface Ag: NONREACTIVE

## 2022-11-05 ENCOUNTER — Encounter: Payer: Self-pay | Admitting: Family Medicine

## 2022-11-05 ENCOUNTER — Ambulatory Visit (INDEPENDENT_AMBULATORY_CARE_PROVIDER_SITE_OTHER): Payer: BC Managed Care – PPO | Admitting: Family Medicine

## 2022-11-05 VITALS — BP 110/78 | HR 76 | Temp 98.1°F | Ht 75.0 in | Wt 222.0 lb

## 2022-11-05 DIAGNOSIS — H6090 Unspecified otitis externa, unspecified ear: Secondary | ICD-10-CM | POA: Insufficient documentation

## 2022-11-05 DIAGNOSIS — H60391 Other infective otitis externa, right ear: Secondary | ICD-10-CM

## 2022-11-05 DIAGNOSIS — H6122 Impacted cerumen, left ear: Secondary | ICD-10-CM

## 2022-11-05 MED ORDER — NEOMYCIN-POLYMYXIN-HC 1 % OT SOLN: 3.0000 [drp] | Freq: Three times a day (TID) | OTIC | 0 refills | Status: AC
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
## 2023-07-12 ENCOUNTER — Ambulatory Visit (INDEPENDENT_AMBULATORY_CARE_PROVIDER_SITE_OTHER): Admitting: Family Medicine

## 2023-07-12 ENCOUNTER — Encounter: Payer: Self-pay | Admitting: Family Medicine

## 2023-07-12 VITALS — BP 122/80 | HR 87 | Temp 97.8°F | Ht 75.0 in | Wt 228.8 lb

## 2023-07-12 DIAGNOSIS — R059 Cough, unspecified: Secondary | ICD-10-CM | POA: Diagnosis not present

## 2023-07-12 DIAGNOSIS — K219 Gastro-esophageal reflux disease without esophagitis: Secondary | ICD-10-CM

## 2023-07-12 MED ORDER — PANTOPRAZOLE SODIUM 20 MG PO TBEC
20.0000 mg | DELAYED_RELEASE_TABLET | Freq: Every day | ORAL | 0 refills | Status: DC
Start: 2023-07-12 — End: 2023-09-15

## 2023-07-12 MED ORDER — PREDNISONE 10 MG PO TABS
10.0000 mg | ORAL_TABLET | Freq: Two times a day (BID) | ORAL | 0 refills | Status: AC
Start: 2023-07-12 — End: 2023-07-19

## 2023-07-12 NOTE — Progress Notes (Signed)
 Established Patient Office Visit   Subjective:  Patient ID: Samuel Andrade, male    DOB: 1981-01-10  Age: 43 y.o. MRN: [id number]  Chief Complaint  Patient presents with   Cough    Last month dry cough at night, 24 hr allergy pills didn't help    Cough Associated symptoms include heartburn. Pertinent negatives include no eye redness, myalgias, rash, shortness of breath or wheezing.   Encounter Diagnoses  Name Primary?   Cough, unspecified type Yes   Gastroesophageal reflux disease, unspecified whether esophagitis present    3 to 4-week history of a dry cough.  There have been no fevers chills, congestion, postnasal drip, sneezing, itchy watery eyes ears nose or throat, wheezing, difficulty breathing or asthma history.  Decreased tobacco usage.  He does have reflux 2-4 times a week controlled with Tums.  Continues with recovery.   Review of Systems  Constitutional: Negative.   HENT: Negative.  Negative for congestion.   Eyes:  Negative for blurred vision, discharge and redness.  Respiratory:  Positive for cough. Negative for sputum production, shortness of breath and wheezing.   Cardiovascular: Negative.   Gastrointestinal:  Positive for heartburn. Negative for abdominal pain.  Genitourinary: Negative.   Musculoskeletal: Negative.  Negative for myalgias.  Skin:  Negative for rash.  Neurological:  Negative for tingling, loss of consciousness and weakness.  Endo/Heme/Allergies:  Negative for polydipsia.     Current Outpatient Medications:    pantoprazole (PROTONIX) 20 MG tablet, Take 1 tablet (20 mg total) by mouth daily., Disp: 90 tablet, Rfl: 0   predniSONE  (DELTASONE ) 10 MG tablet, Take 1 tablet (10 mg total) by mouth 2 (two) times daily with a meal for 7 days., Disp: 14 tablet, Rfl: 0   NEOMYCIN -POLYMYXIN-HYDROCORTISONE (CORTISPORIN) 1 % SOLN OTIC solution, Place 3 drops into the right ear every 8 (eight) hours. (Patient not taking: Reported on 07/12/2023), Disp: 10 mL, Rfl:  0   Objective:     BP 122/80 (Cuff Size: Normal)   Pulse 87   Temp 97.8 F (36.6 C) (Temporal)   Ht 6' 3 (1.905 m)   Wt 228 lb 12.8 oz (103.8 kg)   SpO2 98%   BMI 28.60 kg/m    Physical Exam Constitutional:      General: He is not in acute distress.    Appearance: Normal appearance. He is not ill-appearing, toxic-appearing or diaphoretic.  HENT:     Head: Normocephalic and atraumatic.     Right Ear: External ear normal.     Left Ear: External ear normal.     Mouth/Throat:     Mouth: Mucous membranes are moist.     Pharynx: Oropharynx is clear. No oropharyngeal exudate or posterior oropharyngeal erythema.   Eyes:     General: No scleral icterus.       Right eye: No discharge.        Left eye: No discharge.     Extraocular Movements: Extraocular movements intact.     Conjunctiva/sclera: Conjunctivae normal.     Pupils: Pupils are equal, round, and reactive to light.    Cardiovascular:     Rate and Rhythm: Normal rate and regular rhythm.  Pulmonary:     Effort: Pulmonary effort is normal. No respiratory distress.     Breath sounds: Normal breath sounds. No wheezing, rhonchi or rales.  Abdominal:     General: Bowel sounds are normal.     Tenderness: There is no abdominal tenderness. There is no guarding or rebound.  Musculoskeletal:     Cervical back: No rigidity or tenderness.   Skin:    General: Skin is warm and dry.   Neurological:     Mental Status: He is alert and oriented to person, place, and time.   Psychiatric:        Mood and Affect: Mood normal.        Behavior: Behavior normal.      No results found for any visits on 07/12/23.    The ASCVD Risk score (Arnett DK, et al., 2019) failed to calculate for the following reasons:   Cannot find a previous HDL lab   Cannot find a previous total cholesterol lab    Assessment & Plan:   Cough, unspecified type -     predniSONE ; Take 1 tablet (10 mg total) by mouth 2 (two) times daily with a meal  for 7 days.  Dispense: 14 tablet; Refill: 0 -     DG Chest 2 View; Future  Gastroesophageal reflux disease, unspecified whether esophagitis present -     Pantoprazole Sodium; Take 1 tablet (20 mg total) by mouth daily.  Dispense: 90 tablet; Refill: 0    Return in about 9 weeks (around 09/13/2023).  Continue smoking cessation efforts.  Start pantoprazole 20 mg daily.  Elsie Sim Lent, MD

## 2023-07-27 ENCOUNTER — Telehealth: Payer: Self-pay | Admitting: Family Medicine

## 2023-07-27 NOTE — Telephone Encounter (Signed)
 ERROR

## 2023-07-29 ENCOUNTER — Encounter: Payer: Self-pay | Admitting: Family Medicine

## 2023-07-29 NOTE — Telephone Encounter (Unsigned)
 Copied from CRM [id number]. Topic: Clinical - Request for Lab/Test Order >> Jul 29, 2023  3:08 PM Robinson H wrote: Reason for CRM: Patient states he was supposed to have imaging done and no one reached out to him.  Chandra [phone]

## 2023-08-03 ENCOUNTER — Ambulatory Visit (INDEPENDENT_AMBULATORY_CARE_PROVIDER_SITE_OTHER)
Admission: RE | Admit: 2023-08-03 | Discharge: 2023-08-03 | Disposition: A | Discharge status: Home / Self Care | Source: Ambulatory Visit | Attending: Family Medicine | Admitting: Family Medicine

## 2023-08-03 DIAGNOSIS — R059 Cough, unspecified: Secondary | ICD-10-CM

## 2023-08-10 ENCOUNTER — Ambulatory Visit: Admitting: Family Medicine

## 2023-09-13 ENCOUNTER — Ambulatory Visit: Admitting: Family Medicine

## 2023-09-15 ENCOUNTER — Encounter: Payer: Self-pay | Admitting: Family Medicine

## 2023-09-15 ENCOUNTER — Ambulatory Visit: Admitting: Family Medicine

## 2023-09-15 VITALS — BP 118/80 | HR 60 | Temp 97.9°F | Ht 75.0 in | Wt 219.0 lb

## 2023-09-15 DIAGNOSIS — K219 Gastro-esophageal reflux disease without esophagitis: Secondary | ICD-10-CM

## 2023-09-15 DIAGNOSIS — Z720 Tobacco use: Secondary | ICD-10-CM | POA: Diagnosis not present

## 2023-09-15 MED ORDER — PANTOPRAZOLE SODIUM 20 MG PO TBEC
20.0000 mg | DELAYED_RELEASE_TABLET | Freq: Every day | ORAL | 0 refills | Status: AC
Start: 2023-09-15 — End: ?

## 2023-09-15 NOTE — Progress Notes (Signed)
 Established Patient Office Visit   Subjective:  Patient ID: Samuel Andrade, male    DOB: 26-Aug-1980  Age: 43 y.o. MRN: [id number]  Chief Complaint  Patient presents with   Medical Management of Chronic Issues    9 week follow up. Pt is not fasting. Pt states Protonix  helps.     HPI Encounter Diagnoses  Name Primary?   Gastroesophageal reflux disease, unspecified whether esophagitis present Yes   Tobacco use    For follow-up of reflux after starting pantoprazole .  Symptoms are greatly improved if not resolved.  No issues taking the pantoprazole .  Continues to smoke about 4 cigarettes daily.   Review of Systems  Constitutional: Negative.   HENT: Negative.    Eyes:  Negative for blurred vision, discharge and redness.  Respiratory: Negative.    Cardiovascular: Negative.   Gastrointestinal:  Negative for abdominal pain and heartburn.  Genitourinary: Negative.   Musculoskeletal: Negative.  Negative for myalgias.  Skin:  Negative for rash.  Neurological:  Negative for tingling, loss of consciousness and weakness.  Endo/Heme/Allergies:  Negative for polydipsia.     Current Outpatient Medications:    NEOMYCIN -POLYMYXIN-HYDROCORTISONE (CORTISPORIN) 1 % SOLN OTIC solution, Place 3 drops into the right ear every 8 (eight) hours. (Patient not taking: Reported on 09/15/2023), Disp: 10 mL, Rfl: 0   pantoprazole  (PROTONIX ) 20 MG tablet, Take 1 tablet (20 mg total) by mouth daily., Disp: 90 tablet, Rfl: 0   Objective:     BP 118/80 (BP Location: Right Arm, Patient Position: Sitting, Cuff Size: Normal)   Pulse 60   Temp 97.9 F (36.6 C) (Temporal)   Ht 6' 3 (1.905 m)   Wt 219 lb (99.3 kg)   SpO2 97%   BMI 27.37 kg/m    Physical Exam Constitutional:      General: He is not in acute distress.    Appearance: Normal appearance. He is not ill-appearing, toxic-appearing or diaphoretic.  HENT:     Head: Normocephalic and atraumatic.     Right Ear: External ear normal.     Left  Ear: External ear normal.  Eyes:     General: No scleral icterus.       Right eye: No discharge.        Left eye: No discharge.     Extraocular Movements: Extraocular movements intact.     Conjunctiva/sclera: Conjunctivae normal.  Pulmonary:     Effort: Pulmonary effort is normal. No respiratory distress.  Skin:    General: Skin is warm and dry.  Neurological:     Mental Status: He is alert and oriented to person, place, and time.  Psychiatric:        Mood and Affect: Mood normal.        Behavior: Behavior normal.      No results found for any visits on 09/15/23.    The ASCVD Risk score (Arnett DK, et al., 2019) failed to calculate for the following reasons:   Cannot find a previous HDL lab   Cannot find a previous total cholesterol lab    Assessment & Plan:   Gastroesophageal reflux disease, unspecified whether esophagitis present -     Pantoprazole  Sodium; Take 1 tablet (20 mg total) by mouth daily.  Dispense: 90 tablet; Refill: 0  Tobacco use    Return in about 3 months (around 12/15/2023) for annual physical, chronic disease follow-up.  Return for physical in 3 months.  Will consider taper off of pantoprazole  at that time.  Encouraged tobacco  cessation.  Information was given on managing quitting smoking  Elsie Sim Lent, MD

## 2023-09-17 ENCOUNTER — Ambulatory Visit
Admission: RE | Admit: 2023-09-17 | Discharge: 2023-09-17 | Disposition: A | Discharge status: Home / Self Care | Source: Ambulatory Visit | Attending: Family Medicine | Admitting: Family Medicine

## 2023-09-17 ENCOUNTER — Ambulatory Visit (INDEPENDENT_AMBULATORY_CARE_PROVIDER_SITE_OTHER)

## 2023-09-17 VITALS — BP 128/86 | HR 77 | Temp 98.2°F | Resp 18 | Ht 76.0 in | Wt 220.0 lb

## 2023-09-17 DIAGNOSIS — S99912A Unspecified injury of left ankle, initial encounter: Secondary | ICD-10-CM | POA: Diagnosis not present

## 2023-09-17 DIAGNOSIS — M25572 Pain in left ankle and joints of left foot: Secondary | ICD-10-CM

## 2023-09-17 NOTE — Discharge Instructions (Addendum)
 Advised patient of left ankle x-ray results with hardcopy provided.  Advised patient to RICE affected area of left ankle for 30 minutes 3 times daily for the next 3 days.  Advised may take OTC Ibuprofen  800 mg daily or as needed for left ankle pain.  Advised if symptoms worsen and/or unresolved please follow-up with your PCP or [facility] orthopedics for further evaluation.

## 2023-09-17 NOTE — ED Provider Notes (Signed)
 [facility]    CSN: [id number] Arrival date & time: 09/17/23  1059      History   Chief Complaint Chief Complaint  Patient presents with   Ankle Pain    Entered by patient    HPI KAY RICCIUTI is a 43 y.o. male.   HPI 43 year old male presents with left ankle pain for 2 days reports tripping over his dog.  PMH significant for neuromuscular disorder, history of substance abuse, and hepatitis C  Past Medical History:  Diagnosis Date   Anxiety    Depression    Neuromuscular disorder (HCC)    Opioid abuse (HCC)    Opioid overdose (HCC)    Substance abuse (HCC)     Patient Active Problem List   Diagnosis Date Noted   Otitis externa 11/05/2022   Low back pain 05/19/2018   History of substance abuse (HCC) 11/14/2017   Hepatitis C virus infection cured after antiviral drug therapy 08/19/2014   Tobacco abuse 08/19/2014    Past Surgical History:  Procedure Laterality Date   FRACTURE SURGERY     HERNIA REPAIR     SPINE SURGERY         Home Medications    Prior to Admission medications   Medication Sig Start Date End Date Taking? Authorizing Provider  pantoprazole  (PROTONIX ) 20 MG tablet Take 1 tablet (20 mg total) by mouth daily. 09/15/23  Yes Berneta Elsie Sayre, MD  NEOMYCIN -POLYMYXIN-HYDROCORTISONE (CORTISPORIN) 1 % SOLN OTIC solution Place 3 drops into the right ear every 8 (eight) hours. Patient not taking: Reported on 09/15/2023 11/05/22   Berneta Elsie Sayre, MD    Family History Family History  Problem Relation Age of Onset   Hypertension Father    Healthy Mother    Stroke Maternal Grandmother    Mental illness Maternal Grandmother    Stroke Maternal Grandfather    Heart disease Maternal Grandfather    Stroke Paternal Grandmother    Diabetes Paternal Grandfather     Social History Social History   Tobacco Use   Smoking status: Every Day    Current packs/day: 0.50    Average packs/day: 0.5 packs/day for 9.0 years (4.5 ttl  pk-yrs)    Types: Cigarettes   Smokeless tobacco: Never  Vaping Use   Vaping status: Never Used  Substance Use Topics   Alcohol  use: Yes    Comment: rarely   Drug use: Not Currently    Types: IV    Comment: heroin, Xanax     Allergies   Patient has no known allergies.   Review of Systems Review of Systems  Musculoskeletal:        Left ankle pain secondary to left ankle injury  All other systems reviewed and are negative.    Physical Exam Triage Vital Signs ED Triage Vitals  Encounter Vitals Group     BP 09/17/23 1141 128/86     Girls Systolic BP Percentile --      Girls Diastolic BP Percentile --      Boys Systolic BP Percentile --      Boys Diastolic BP Percentile --      Pulse Rate 09/17/23 1141 77     Resp 09/17/23 1141 18     Temp 09/17/23 1141 98.2 F (36.8 C)     Temp Source 09/17/23 1141 Oral     SpO2 09/17/23 1141 95 %     Weight 09/17/23 1143 220 lb (99.8 kg)     Height 09/17/23 1143 6'  4 (1.93 m)     Head Circumference --      Peak Flow --      Pain Score 09/17/23 1143 7     Pain Loc --      Pain Education --      Exclude from Growth Chart --    No data found.  Updated Vital Signs BP 128/86 (BP Location: Right Arm)   Pulse 77   Temp 98.2 F (36.8 C) (Oral)   Resp 18   Ht 6' 4 (1.93 m)   Wt 220 lb (99.8 kg)   SpO2 95%   BMI 26.78 kg/m   Physical Exam Vitals and nursing note reviewed.  Constitutional:      General: He is not in acute distress.    Appearance: Normal appearance. He is normal weight. He is not ill-appearing.  HENT:     Head: Normocephalic and atraumatic.     Mouth/Throat:     Mouth: Mucous membranes are moist.     Pharynx: Oropharynx is clear.  Eyes:     Extraocular Movements: Extraocular movements intact.     Conjunctiva/sclera: Conjunctivae normal.     Pupils: Pupils are equal, round, and reactive to light.  Cardiovascular:     Rate and Rhythm: Normal rate and regular rhythm.     Pulses: Normal pulses.      Heart sounds: Normal heart sounds.  Pulmonary:     Effort: Pulmonary effort is normal.     Breath sounds: Normal breath sounds. No wheezing, rhonchi or rales.  Musculoskeletal:        General: Normal range of motion.     Comments: Left ankle: Mild soft tissue swelling noted Limited range of motion with flexion/extension  Skin:    General: Skin is warm and dry.  Neurological:     General: No focal deficit present.     Mental Status: He is alert and oriented to person, place, and time. Mental status is at baseline.  Psychiatric:        Mood and Affect: Mood normal.        Behavior: Behavior normal.      UC Treatments / Results  Labs (all labs ordered are listed, but only abnormal results are displayed) Labs Reviewed - No data to display  EKG   Radiology DG Ankle Complete Left Result Date: 09/17/2023 CLINICAL DATA:  Two day history of left ankle injury after tripping on his dog EXAM: LEFT ANKLE COMPLETE - 3 VIEW COMPARISON:  None Available. FINDINGS: There are no findings of fracture or dislocation. No joint effusion. Ankle mortise is intact. Soft tissues are unremarkable. IMPRESSION: No acute fracture or dislocation. Electronically Signed   By: Limin  Xu M.D.   On: 09/17/2023 12:02    Procedures Procedures (including critical care time)  Medications Ordered in UC Medications - No data to display  Initial Impression / Assessment and Plan / UC Course  I have reviewed the triage vital signs and the nursing notes.  Pertinent labs & imaging results that were available during my care of the patient were reviewed by me and considered in my medical decision making (see chart for details).     MDM: 1.  Acute left ankle pain-left ankle x-ray results revealed above patient advised; 2.  Injury of left ankle, initial encounter-Advised patient of left ankle x-ray results with hardcopy provided.  Advised patient to RICE affected area of left ankle for 30 minutes 3 times daily for the next  3 days.  Advised may take OTC Ibuprofen  800 mg daily or as needed for left ankle pain.  Advised if symptoms worsen and/or unresolved please follow-up with your PCP or [facility] orthopedics for further evaluation.  Patient discharged home, hemodynamically stable. Final Clinical Impressions(s) / UC Diagnoses   Final diagnoses:  Acute left ankle pain  Injury of left ankle, initial encounter     Discharge Instructions      Advised patient of left ankle x-ray results with hardcopy provided.  Advised patient to RICE affected area of left ankle for 30 minutes 3 times daily for the next 3 days.  Advised may take OTC Ibuprofen  800 mg daily or as needed for left ankle pain.  Advised if symptoms worsen and/or unresolved please follow-up with your PCP or [facility] orthopedics for further evaluation.     ED Prescriptions   None    PDMP not reviewed this encounter.   Teddy Sharper, FNP 09/17/23 1256

## 2023-09-17 NOTE — ED Notes (Signed)
 Patient requested an ACE wrap prior to leaving, will apply when he gets home.  Ok per Ozell Major, NP.

## 2023-09-17 NOTE — ED Triage Notes (Signed)
 Patient states that his dog tripped him causing injury to his left ankle x 2 days ago.  Some swelling and pain when walking.  Patient has taken Tylenol .

## 2023-09-20 DIAGNOSIS — F1121 Opioid dependence, in remission: Secondary | ICD-10-CM | POA: Diagnosis not present

## 2023-09-20 DIAGNOSIS — F1521 Other stimulant dependence, in remission: Secondary | ICD-10-CM | POA: Diagnosis not present

## 2023-09-20 DIAGNOSIS — F321 Major depressive disorder, single episode, moderate: Secondary | ICD-10-CM | POA: Diagnosis not present

## 2023-10-04 DIAGNOSIS — F1121 Opioid dependence, in remission: Secondary | ICD-10-CM | POA: Diagnosis not present

## 2023-10-04 DIAGNOSIS — F321 Major depressive disorder, single episode, moderate: Secondary | ICD-10-CM | POA: Diagnosis not present

## 2023-10-04 DIAGNOSIS — F1521 Other stimulant dependence, in remission: Secondary | ICD-10-CM | POA: Diagnosis not present

## 2023-11-01 DIAGNOSIS — F1521 Other stimulant dependence, in remission: Secondary | ICD-10-CM | POA: Diagnosis not present

## 2023-11-01 DIAGNOSIS — F321 Major depressive disorder, single episode, moderate: Secondary | ICD-10-CM | POA: Diagnosis not present

## 2023-11-01 DIAGNOSIS — F1121 Opioid dependence, in remission: Secondary | ICD-10-CM | POA: Diagnosis not present

## 2023-11-15 DIAGNOSIS — F1521 Other stimulant dependence, in remission: Secondary | ICD-10-CM | POA: Diagnosis not present

## 2023-11-15 DIAGNOSIS — F1121 Opioid dependence, in remission: Secondary | ICD-10-CM | POA: Diagnosis not present

## 2023-11-15 DIAGNOSIS — F321 Major depressive disorder, single episode, moderate: Secondary | ICD-10-CM | POA: Diagnosis not present

## 2023-12-06 DIAGNOSIS — F331 Major depressive disorder, recurrent, moderate: Secondary | ICD-10-CM | POA: Diagnosis not present

## 2023-12-15 DIAGNOSIS — F411 Generalized anxiety disorder: Secondary | ICD-10-CM | POA: Diagnosis not present

## 2023-12-15 DIAGNOSIS — F4311 Post-traumatic stress disorder, acute: Secondary | ICD-10-CM | POA: Diagnosis not present

## 2023-12-19 DIAGNOSIS — F331 Major depressive disorder, recurrent, moderate: Secondary | ICD-10-CM | POA: Diagnosis not present

## 2023-12-19 DIAGNOSIS — F411 Generalized anxiety disorder: Secondary | ICD-10-CM | POA: Diagnosis not present

## 2023-12-19 DIAGNOSIS — F1121 Opioid dependence, in remission: Secondary | ICD-10-CM | POA: Diagnosis not present

## 2023-12-19 DIAGNOSIS — F1521 Other stimulant dependence, in remission: Secondary | ICD-10-CM | POA: Diagnosis not present

## 2023-12-22 DIAGNOSIS — F331 Major depressive disorder, recurrent, moderate: Secondary | ICD-10-CM | POA: Diagnosis not present

## 2023-12-22 DIAGNOSIS — F411 Generalized anxiety disorder: Secondary | ICD-10-CM | POA: Diagnosis not present

## 2023-12-29 DIAGNOSIS — F331 Major depressive disorder, recurrent, moderate: Secondary | ICD-10-CM | POA: Diagnosis not present

## 2023-12-29 DIAGNOSIS — F411 Generalized anxiety disorder: Secondary | ICD-10-CM | POA: Diagnosis not present
